# Patient Record
Sex: Female | Born: 1970 | Race: White | Hispanic: Yes | Marital: Married | Smoking: Never smoker
Health system: Southern US, Community
[De-identification: ages and names within clinical notes are randomized; demographics above are authoritative.]

## PROBLEM LIST (undated history)

## (undated) DIAGNOSIS — C801 Malignant (primary) neoplasm, unspecified: Secondary | ICD-10-CM

## (undated) DIAGNOSIS — E7800 Pure hypercholesterolemia, unspecified: Secondary | ICD-10-CM

## (undated) HISTORY — DX: Pure hypercholesterolemia, unspecified: E78.00

---

## 2005-08-22 ENCOUNTER — Other Ambulatory Visit: Admission: RE | Admit: 2005-08-22 | Discharge: 2005-08-22 | Payer: Self-pay | Admitting: Gynecology

## 2006-03-09 ENCOUNTER — Inpatient Hospital Stay (HOSPITAL_COMMUNITY): Admission: AD | Admit: 2006-03-09 | Discharge: 2006-03-12 | Payer: Self-pay | Admitting: Gynecology

## 2006-04-21 ENCOUNTER — Other Ambulatory Visit: Admission: RE | Admit: 2006-04-21 | Discharge: 2006-04-21 | Payer: Self-pay | Admitting: Gynecology

## 2007-04-11 ENCOUNTER — Inpatient Hospital Stay (HOSPITAL_COMMUNITY): Admission: AD | Admit: 2007-04-11 | Discharge: 2007-04-13 | Payer: Self-pay | Admitting: Obstetrics and Gynecology

## 2009-01-04 ENCOUNTER — Inpatient Hospital Stay (HOSPITAL_COMMUNITY): Admission: AD | Admit: 2009-01-04 | Discharge: 2009-01-04 | Payer: Self-pay | Admitting: Obstetrics and Gynecology

## 2009-10-14 ENCOUNTER — Ambulatory Visit: Payer: Self-pay | Admitting: Gynecology

## 2011-02-07 ENCOUNTER — Other Ambulatory Visit: Payer: Self-pay | Admitting: Obstetrics and Gynecology

## 2011-02-07 DIAGNOSIS — N6310 Unspecified lump in the right breast, unspecified quadrant: Secondary | ICD-10-CM

## 2011-02-11 ENCOUNTER — Ambulatory Visit
Admission: RE | Admit: 2011-02-11 | Discharge: 2011-02-11 | Disposition: A | Discharge status: Home / Self Care | Payer: Medicaid Other | Source: Ambulatory Visit | Attending: Obstetrics and Gynecology | Admitting: Obstetrics and Gynecology

## 2011-02-11 DIAGNOSIS — N6310 Unspecified lump in the right breast, unspecified quadrant: Secondary | ICD-10-CM

## 2011-02-22 LAB — CBC
HCT: 38.7 % (ref 36.0–46.0)
Hemoglobin: 12.9 g/dL (ref 12.0–15.0)
MCHC: 33.3 g/dL (ref 30.0–36.0)
MCV: 92 fL (ref 78.0–100.0)
Platelets: 263 10*3/uL (ref 150–400)
RBC: 4.21 MIL/uL (ref 3.87–5.11)
RDW: 12.9 % (ref 11.5–15.5)
WBC: 9.9 10*3/uL (ref 4.0–10.5)

## 2011-02-22 LAB — URINALYSIS, ROUTINE W REFLEX MICROSCOPIC
Bilirubin Urine: NEGATIVE
Glucose, UA: NEGATIVE mg/dL
Hgb urine dipstick: NEGATIVE
Ketones, ur: NEGATIVE mg/dL
Nitrite: NEGATIVE
Protein, ur: NEGATIVE mg/dL
Specific Gravity, Urine: 1.01 (ref 1.005–1.030)
Urobilinogen, UA: 0.2 mg/dL (ref 0.0–1.0)
pH: 7.5 (ref 5.0–8.0)

## 2011-02-22 LAB — GC/CHLAMYDIA PROBE AMP, GENITAL
Chlamydia, DNA Probe: NEGATIVE
GC Probe Amp, Genital: NEGATIVE

## 2011-02-22 LAB — WET PREP, GENITAL
Clue Cells Wet Prep HPF POC: NONE SEEN
Trich, Wet Prep: NONE SEEN
Yeast Wet Prep HPF POC: NONE SEEN

## 2011-02-22 LAB — ABO/RH: ABO/RH(D): O POS

## 2011-02-22 LAB — HCG, QUANTITATIVE, PREGNANCY: hCG, Beta Chain, Quant, S: 4900 m[IU]/mL — ABNORMAL HIGH (ref <5)

## 2011-02-22 LAB — POCT PREGNANCY, URINE: Preg Test, Ur: POSITIVE

## 2011-03-22 NOTE — Discharge Summary (Signed)
NAMEJANERA, Molly Prince        ACCOUNT NO.:  [id number]   MEDICAL RECORD NO.:  [id number]          PATIENT TYPE:  INP   LOCATION:  9114                          FACILITY:  [facility]   PHYSICIAN:  Gerrit Friends. Aldona Bar, M.D.   DATE OF BIRTH:  1971-02-09   DATE OF ADMISSION:  04/11/2007  DATE OF DISCHARGE:  04/13/2007                               DISCHARGE SUMMARY   DISCHARGE DIAGNOSIS:  1. Term pregnancy, delivered 6 pounds 13 ounces female infant, Apgars      09/09.  2. Blood type O positive.   PROCEDURE:  1. Normal spontaneous delivery.  2. Left labial laceration with repair.   SUMMARY:  This 40 year old gravida 2, para 1 was admitted at term in  labor.  She progressed, some meconium-stained fluid was noted, requested  and received an epidural, became fully dilated and subsequently  delivered of a viable female infant.  There was a tight nuchal cord  which was reduced at the time of delivery. A left labial laceration was  repaired at the time of delivery.  Postpartum course was benign.  Discharge hemoglobin 10.2, white count of 11,300, platelet count of  208,000.  On the morning of June 6, she was ambulating well, tolerating  a regular diet well, having normal bowel and bladder function, and was  afebrile.  She was breast-feeding and bottle feeding and was desirous of  discharge.  Accordingly she was given all appropriate instructions per  discharge brochure and understood all instructions well.   DISCHARGE MEDICATIONS:  1. Vitamins - 1 day as long she is breast-feeding.  2. Feosol capsules - one daily.  3. Motrin 600 mg every 6 hours as needed for cramping or mild pain.  4. Tylox 1-2 every 4-6 hours as needed for more severe pain.   She will return the office follow-up in approximately four weeks' time  or as needed.   CONDITION ON DISCHARGE:  Improved.      Gerrit Friends. Aldona Bar, M.D.  Electronically Signed     RMW/MEDQ  D:  04/13/2007  T:  04/13/2007  Job:  [id number]

## 2011-08-25 LAB — RPR: RPR Ser Ql: NONREACTIVE

## 2011-08-25 LAB — CBC
HCT: 30.2 — ABNORMAL LOW
HCT: 34.6 — ABNORMAL LOW
Hemoglobin: 10.2 — ABNORMAL LOW
Hemoglobin: 11.7 — ABNORMAL LOW
MCHC: 33.8
MCHC: 33.8
MCV: 90
MCV: 90.1
Platelets: 208
Platelets: 228
RBC: 3.36 — ABNORMAL LOW
RBC: 3.85 — ABNORMAL LOW
RDW: 14.4 — ABNORMAL HIGH
RDW: 14.5 — ABNORMAL HIGH
WBC: 11.3 — ABNORMAL HIGH
WBC: 9.2

## 2014-09-22 ENCOUNTER — Encounter: Payer: Self-pay | Admitting: Gynecology

## 2014-09-22 ENCOUNTER — Other Ambulatory Visit: Payer: Self-pay

## 2014-09-22 ENCOUNTER — Ambulatory Visit (INDEPENDENT_AMBULATORY_CARE_PROVIDER_SITE_OTHER): Payer: BC Managed Care – PPO | Admitting: Gynecology

## 2014-09-22 ENCOUNTER — Other Ambulatory Visit (HOSPITAL_COMMUNITY)
Admission: RE | Admit: 2014-09-22 | Discharge: 2014-09-22 | Disposition: A | Discharge status: Home / Self Care | Payer: BC Managed Care – PPO | Source: Ambulatory Visit | Attending: Gynecology | Admitting: Gynecology

## 2014-09-22 VITALS — BP 110/76 | Ht 60.5 in | Wt 112.0 lb

## 2014-09-22 DIAGNOSIS — Z01419 Encounter for gynecological examination (general) (routine) without abnormal findings: Secondary | ICD-10-CM

## 2014-09-22 DIAGNOSIS — Z1231 Encounter for screening mammogram for malignant neoplasm of breast: Secondary | ICD-10-CM

## 2014-09-22 DIAGNOSIS — Z23 Encounter for immunization: Secondary | ICD-10-CM

## 2014-09-22 DIAGNOSIS — Z1151 Encounter for screening for human papillomavirus (HPV): Secondary | ICD-10-CM | POA: Insufficient documentation

## 2014-09-22 DIAGNOSIS — Z87410 Personal history of cervical dysplasia: Secondary | ICD-10-CM

## 2014-09-22 LAB — COMPREHENSIVE METABOLIC PANEL
ALT: 14 U/L (ref 0–35)
AST: 15 U/L (ref 0–37)
Albumin: 4.2 g/dL (ref 3.5–5.2)
Alkaline Phosphatase: 53 U/L (ref 39–117)
BUN: 9 mg/dL (ref 6–23)
CO2: 25 mEq/L (ref 19–32)
Calcium: 8.9 mg/dL (ref 8.4–10.5)
Chloride: 102 mEq/L (ref 96–112)
Creat: 0.56 mg/dL (ref 0.50–1.10)
Glucose, Bld: 85 mg/dL (ref 70–99)
Potassium: 4 mEq/L (ref 3.5–5.3)
Sodium: 139 mEq/L (ref 135–145)
Total Bilirubin: 0.4 mg/dL (ref 0.2–1.2)
Total Protein: 7.2 g/dL (ref 6.0–8.3)

## 2014-09-22 LAB — CBC WITH DIFFERENTIAL/PLATELET
Basophils Absolute: 0 10*3/uL (ref 0.0–0.1)
Basophils Relative: 0 % (ref 0–1)
Eosinophils Absolute: 0.2 10*3/uL (ref 0.0–0.7)
Eosinophils Relative: 2 % (ref 0–5)
HCT: 38.2 % (ref 36.0–46.0)
Hemoglobin: 13.1 g/dL (ref 12.0–15.0)
Lymphocytes Relative: 21 % (ref 12–46)
Lymphs Abs: 1.7 10*3/uL (ref 0.7–4.0)
MCH: 29.7 pg (ref 26.0–34.0)
MCHC: 34.3 g/dL (ref 30.0–36.0)
MCV: 86.6 fL (ref 78.0–100.0)
MPV: 11.3 fL (ref 9.4–12.4)
Monocytes Absolute: 0.5 10*3/uL (ref 0.1–1.0)
Monocytes Relative: 6 % (ref 3–12)
Neutro Abs: 5.9 10*3/uL (ref 1.7–7.7)
Neutrophils Relative %: 71 % (ref 43–77)
Platelets: 262 10*3/uL (ref 150–400)
RBC: 4.41 MIL/uL (ref 3.87–5.11)
RDW: 14.1 % (ref 11.5–15.5)
WBC: 8.3 10*3/uL (ref 4.0–10.5)

## 2014-09-22 LAB — TSH: TSH: 2.344 u[IU]/mL (ref 0.350–4.500)

## 2014-09-22 LAB — LIPID PANEL
Cholesterol: 174 mg/dL (ref 0–200)
HDL: 50 mg/dL (ref >39)
LDL Cholesterol: 106 mg/dL — ABNORMAL HIGH (ref 0–99)
Total CHOL/HDL Ratio: 3.5 Ratio
Triglycerides: 88 mg/dL (ref <150)
VLDL: 18 mg/dL (ref 0–40)

## 2014-09-22 NOTE — Progress Notes (Signed)
Molly Prince April 22, 1971 [id number]   History:    43 y.o.  for annual gyn exam who has not been seen in the office since 2010. Patient stated that she had a normal Pap smear in [place] last year. 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 Subsequent Pap smears have been normal. Patient had a Mirena IUD placed after one of her pregnancies. It is due to be removed in August 2016. Patient is currently fasting. Patient requesting flu vaccine. Her last mammogram was in 2014 which was normal.  Past medical history,surgical history, family history and social history were all reviewed and documented in the EPIC chart.  Gynecologic History No LMP recorded. Patient is not currently having periods (Reason: IUD). Contraception: IUD Last Pap: 2014. Results were: patient states that it was in [place] and was normal Last mammogram: 2014. Results were: patient states that it was done here in [place] although we do not have the report and she states it was normal  Obstetric History OB History  Gravida Para Term Preterm AB SAB TAB Ectopic Multiple Living  5 3   2     3     # Outcome Date GA Lbr Len/2nd Weight Sex Delivery Anes PTL Lv  5 AB           4 AB           3 Para           2 Para           1 Para                ROS: A ROS was performed and pertinent positives and negatives are included in the history.  GENERAL: No fevers or chills. HEENT: No change in vision, no earache, sore throat or sinus congestion. NECK: No pain or stiffness. CARDIOVASCULAR: No chest pain or pressure. No palpitations. PULMONARY: No shortness of breath, cough or wheeze. GASTROINTESTINAL: No abdominal pain, nausea, vomiting or diarrhea, melena or bright red blood per rectum. GENITOURINARY: No urinary frequency, urgency, hesitancy or dysuria. MUSCULOSKELETAL: No joint or muscle pain, no back pain, no recent trauma. DERMATOLOGIC: No  rash, no itching, no lesions. ENDOCRINE: No polyuria, polydipsia, no heat or cold intolerance. No recent change in weight. HEMATOLOGICAL: No anemia or easy bruising or bleeding. NEUROLOGIC: No headache, seizures, numbness, tingling or weakness. PSYCHIATRIC: No depression, no loss of interest in normal activity or change in sleep pattern.     Exam: chaperone present  BP 110/76 mmHg  Ht 5' 0.5" (1.537 m)  Wt 112 lb (50.803 kg)  BMI 21.51 kg/m2  Body mass index is 21.51 kg/(m^2).  General appearance : Well developed well nourished female. No acute distress HEENT: Neck supple, trachea midline, no carotid bruits, no thyroidmegaly Lungs: Clear to auscultation, no rhonchi or wheezes, or rib retractions  Heart: Regular rate and rhythm, no murmurs or gallops Breast:Examined in sitting and supine position were symmetrical in appearance, no palpable masses or tenderness,  no skin retraction, no nipple inversion, no nipple discharge, no skin discoloration, no axillary or supraclavicular lymphadenopathy Abdomen: no palpable masses or tenderness, no rebound or guarding Extremities: no edema or skin discoloration or tenderness  Pelvic:  Bartholin, Urethra, Skene Glands: Within normal limits             Vagina: No gross lesions or discharge  Cervix: No gross lesions or discharge, IUD string seen  Uterus  anteverted, normal size, shape and consistency, non-tender and mobile  Adnexa  Without masses or tenderness  Anus and perineum  normal   Rectovaginal  normal sphincter tone without palpated masses or tenderness             Hemoccult not indicated     Assessment/Plan:  44 y.o. female for annual exam due to return in August 2016 to change her Mirena IUD. We discussed importance of monthly breast exam. Pap smear with HPV screening was done today. The following labs were ordered today: CBC, fasting lipid profile, comprehensive metabolic panel, TSH, and urinalysis. Patient received the flu vaccine  today.   Terrance Mass MD, 11:40 AM 09/22/2014

## 2014-09-22 NOTE — Addendum Note (Signed)
Addended by: Thurnell Garbe A on: 09/22/2014 11:46 AM   Modules accepted: Orders

## 2014-09-22 NOTE — Patient Instructions (Signed)
Intranasal H1N1 Influenza (swine flu) Vaccine Qu es este medicamento? VACUNA INTRANASAL CONTRA LA INFLUENZA H1N1 (GRIPE PORCINA) es una vacuna para proteger contra una infeccin de la influenza pandmica H1N1, tambin conocida como la gripe porcina. La vacuna solo ayuda a protegerle contra esta cepa de influenza. Esta vacuna no ayuda a reducir el riesgo de contraer otros tipos de la influenza. Tambin puede ser necesario ponerse la vacuna contra la influenza estacional. Este medicamento puede ser utilizado para otros usos; si tiene alguna pregunta consulte con su proveedor de atencin mdica o con su farmacutico. Qu le debo informar a mi profesional de la salud antes de tomar este medicamento? Necesita saber si usted presenta alguno de los siguientes problemas o situaciones: -asma o sibilancias -sndrome de Guillain-Barre -problemas del sistema inmunolgico -si es menor de 18 aos de edad y toma aspirina -una reaccin alrgica o inusual vacuna antigripal intranasales, a los huevos, a la gentamicina, a la gelatina, a la arginina, a otros medicamentos, alimentos, colorantes o conservantes -si est embarazada o buscando quedar embarazada -si est amamantando a un beb Cmo debo [employer]? La vacuna se administra en la nariz. La administra un profesional de [employer]	[place]. Recibir una copia de informacin escrita sobre la vacuna antes de cada vacuna. Asegrese de leer este folleto cada vez cuidadosamente. Este folleto puede cambiar con frecuencia. Hable con su pediatra para informarse acerca del uso de este medicamento en nios. Aunque este medicamento ha sido recetado a nios tan menores como de 2 aos de edad para condiciones selectivas, las precauciones se aplican. Sobredosis: Pngase en contacto inmediatamente con un centro toxicolgico o una sala de urgencia si usted cree que haya tomado demasiado medicamento. ATENCIN: [employer] es solo para usted. No comparta este  medicamento con nadie. Qu sucede si me olvido de una dosis? Si es necesario, mantenga sus citas para las siguientes dosis (dosis de Public librarian) como se le haya indicado. Es importante de no olvidarse de ninguna dosis. Informe a su mdico o a su profesional de la salud si no puede asistir a Photographer. Qu puede interactuar con este medicamento? No tome esta medicina con ninguno de los siguientes medicamentos: -anakinra -rilonacept -inhibidores del factor de necrosis tumoral (FNT), tales como adalimumab, etanercept, infliximab, golimumab o certolizumab Esta medicina tambin puede interactuar con los siguientes medicamentos: -aspirina o medicamentos tipo aspirina -medicamentos para un transplante de rganos -medicamentos para tratar el cncer -medicamentos para tratar la gripe -otras vacunas -medicamentos esteroideos, como la prednisona o la cortisona Puede ser que esta lista no menciona todas las posibles interacciones. Informe a su profesional de [employer]	[place] de [employer] productos a base de hierbas, medicamentos de [place] o suplementos nutritivos que est tomando. Si usted fuma, consume bebidas alcohlicas o si utiliza drogas ilegales, indqueselo tambin a su profesional de [employer]	[place]. Algunas sustancias pueden interactuar con su medicamento. A qu debo estar atento al usar [employer]? Informe a su mdico inmediatamente sobre [employer] secundarios. Despus de recibir esta vacuna, evite tener contacto cercano con personas que tengan problemas del sistema inmunolgico graves durante 7 [place]. Usted puede darles la gripe. Esta vacuna reduce el riesgo de contraer la influenza pandmica H1N1. Puede contraer una infeccin de la influenza H1N1 ms leve si est alrededor de personas que tienen esta influenza. Esta vacuna antigripal no le protege contra resfros u otras enfermedades incluyendo otros virus de la influenza. Tambin puede ser necesario ponerse la vacuna contra la influenza  estacional. Qu efectos secundarios puedo Best boy  al Ardell Isaacs este medicamento? Efectos secundarios que debe informar a su mdico o a Barrister's clerk de la salud tan pronto como sea posible: -Chief of Staff como erupcin cutnea, picazn o urticarias, hinchazn de la cara, labios o lengua -problemas respiratorios -debilidad muscular -parlisis inusual de la cara Efectos secundarios que, por lo general, no requieren atencin mdica (debe informarlos a su mdico o a su profesional de la salud si persisten o si son molestos): -escalofros -tos -dolor de cabeza -dolores o molestias musculares -goteo de la nariz o Lawyer tapada -dolor de garganta -molestar estomacal -cansancio Puede ser que esta lista no menciona todos los posibles efectos secundarios. Comunquese a su mdico por asesoramiento mdico [employer] Usted puede informar los efectos secundarios a la FDA por telfono al 1-800-FDA-1088. Dnde debo guardar mi medicina? Esta vacuna se administrar en una clnica, farmacia, o en el consultorio de un mdico o un profesional de la salud. No se le entregar la vacuna para guardar en su domicilio. ATENCIN: Este folleto es un resumen. Puede ser que no cubra toda la posible informacin. Si usted tiene preguntas acerca de esta medicina, consulte con su mdico, su farmacutico o su profesional de Technical sales engineer.  2015, Elsevier/Gold Standard. (2009-06-12 15:22:03)

## 2014-09-23 LAB — URINALYSIS W MICROSCOPIC + REFLEX CULTURE
Bacteria, UA: NONE SEEN
Bilirubin Urine: NEGATIVE
Casts: NONE SEEN
Crystals: NONE SEEN
Glucose, UA: NEGATIVE mg/dL
Hgb urine dipstick: NEGATIVE
Ketones, ur: NEGATIVE mg/dL
Leukocytes, UA: NEGATIVE
Nitrite: NEGATIVE
Protein, ur: NEGATIVE mg/dL
Specific Gravity, Urine: <1.005 — ABNORMAL LOW (ref 1.005–1.030)
Squamous Epithelial / LPF: NONE SEEN
Urobilinogen, UA: 0.2 mg/dL (ref 0.0–1.0)
pH: 7 (ref 5.0–8.0)

## 2014-09-24 LAB — CYTOLOGY - PAP

## 2014-09-29 ENCOUNTER — Encounter: Payer: Self-pay | Admitting: Gynecology

## 2014-10-14 ENCOUNTER — Ambulatory Visit
Admission: RE | Admit: 2014-10-14 | Discharge: 2014-10-14 | Disposition: A | Discharge status: Home / Self Care | Payer: BC Managed Care – PPO | Source: Ambulatory Visit

## 2014-10-14 DIAGNOSIS — Z1231 Encounter for screening mammogram for malignant neoplasm of breast: Secondary | ICD-10-CM

## 2015-08-17 ENCOUNTER — Telehealth: Payer: Self-pay | Admitting: Gynecology

## 2015-08-17 NOTE — Telephone Encounter (Signed)
08/17/15-Claudia to call pt to let her know that the [facility] insurance she has will cover the Mirena and removal of old one at 100%, no copay, for contraception.wl

## 2015-09-14 ENCOUNTER — Encounter: Payer: Self-pay | Admitting: Gynecology

## 2015-09-14 ENCOUNTER — Ambulatory Visit (INDEPENDENT_AMBULATORY_CARE_PROVIDER_SITE_OTHER): Payer: 59 | Admitting: Gynecology

## 2015-09-14 VITALS — BP 112/70

## 2015-09-14 DIAGNOSIS — Z975 Presence of (intrauterine) contraceptive device: Secondary | ICD-10-CM | POA: Insufficient documentation

## 2015-09-14 DIAGNOSIS — Z8742 Personal history of other diseases of the female genital tract: Secondary | ICD-10-CM

## 2015-09-14 DIAGNOSIS — Z30433 Encounter for removal and reinsertion of intrauterine contraceptive device: Secondary | ICD-10-CM

## 2015-09-14 LAB — WET PREP FOR TRICH, YEAST, CLUE
Clue Cells Wet Prep HPF POC: NONE SEEN
Trich, Wet Prep: NONE SEEN
WBC, Wet Prep HPF POC: NONE SEEN
Yeast Wet Prep HPF POC: NONE SEEN

## 2015-09-14 NOTE — Patient Instructions (Signed)
Colocación de un dispositivo intrauterino - Cuidados posteriores  (Intrauterine Device Insertion, Care After)  Siga estas instrucciones durante las próximas semanas. Estas indicaciones le proporcionan información general acerca de cómo deberá cuidarse después del procedimiento. El médico también podrá darle instrucciones más específicas. El tratamiento ha sido planificado según las prácticas médicas actuales, pero en algunos casos pueden ocurrir problemas. Comuníquese con el médico si tiene algún problema o tiene dudas después del procedimiento.  QUÉ ESPERAR DESPUÉS DEL PROCEDIMIENTO  La inserción del DIU puede causar molestias, como cólicos. que deberían mejorar una vez que el DIU esté en su lugar. Podrá tener sangrado después del procedimiento. Esto es normal. Varía desde un sangrado ligero durante un par de días hasta un sangrado similar al menstrual. Cuando el DIU esté en su lugar, se extenderá un hilo de 1 a 2 pulgadas (2,5 a 5 cm) por el cuello del útero en la vagina. El hilo no debería molestarle a usted ni a su pareja. De lo contrario, consulte con su médico.   INSTRUCCIONES PARA EL CUIDADO EN EL HOGAR   · Controle su DIU para asegurarse de que esté en su lugar, antes de reanudar la actividad sexual. Tiene que sentir los hilos. Si no los siente, algo puede estar mal. El DIU puede haberse salido del útero o éste puede haber sido atravesado (perforado) durante la colocación. Además, si los hilos son más largos, puede significar que el DIU se está saliendo del útero. Si ocurre alguno de estos problemas, no estará protegida y podrá quedar embarazada.  · Puede volver a tener relaciones sexuales si no tiene problemas con el DIU. El DIU de cobre se considera efectivo y funciona de inmediato, si se inserta dentro de los 7 días del inicio del período. Será necesario que utilice un método anticonceptivo adicional durante 7 días, si el DIU se inserta en algún otro momento del ciclo.  · Controle que el DIU sigue en su  lugar sintiendo los hilos después de cada período menstrual.  · Es posible que necesite tomar analgésicos, como acetaminofeno o ibuprofeno. Tome todos los medicamentos como le indicó el médico.  SOLICITE ATENCIÓN MÉDICA SI:   · Tiene un sangrado más abundante o dura más de un ciclo menstrual normal.  · Tiene fiebre.  · Siente cólicos o dolor abdominal que no se alivian con medicamentos.  · Siente dolor abdominal que no parece estar relacionado con el área en que sentía los cólicos y el dolor anteriormente.  · Se siente mareada, inusualmente débil o se desmaya.  · Tiene flujo vaginal u olores anormales.  · Siente dolor durante las relaciones sexuales.  · No puede sentir los hilos del DIU o los siente más largos.  · Siente que el DIU está en la abertura del cuello del útero, en la vagina.  · Piensa que está embarazada o no tiene su período menstrual.  · El hilo del DIU está lastimando a su pareja sexual.  ASEGÚRESE DE QUE:  · Comprende estas instrucciones.  · Controlará su afección.  · Recibirá ayuda de inmediato si no mejora o si empeora.     Esta información no tiene como fin reemplazar el consejo del médico. Asegúrese de hacerle al médico cualquier pregunta que tenga.     Document Released: 07/18/2012 Document Revised: 08/14/2013  Elsevier Interactive Patient Education ©2016 Elsevier Inc.

## 2015-09-14 NOTE — Progress Notes (Signed)
   Patient is a 44 year old who presented to the office today to remove her expired Mirena IUD with replacement of a new one. She has done well with this Mirena IUD. She has no menstrual cycle. She is scheduled for annual exam next month. She states that time she feels like she has a vaginal discharge. She is in a monogamous relationship.  Prior to removing and placement of the Mirena IUD a wet prep was obtained and was essentially negative.                                                                    IUD procedure note       Patient presented to the office today for placement of Mirena IUD. The patient had previously been provided with literature information on this method of contraception. The risks benefits and pros and cons were discussed and all her questions were answered. She is fully aware that this form of contraception is 99% effective and is good for 5 years.  Pelvic exam: Bartholin urethra Skene glands: Within normal limits Vagina: No lesions or discharge Cervix: No lesions or discharge, IUD string was visualized Uterus: Anteverted position Adnexa: No masses or tenderness Rectal exam: Not done  The cervix was cleansed with Betadine solution. A single-tooth tenaculum was placed on the anterior cervical lip. The IUD string was grasped with a ring forcep retrieved shown to the patient and discarded. The uterus was then sounded to 7 centimeter. The IUD was shown to the patient and inserted in a sterile fashion. The IUD string was trimmed. The single-tooth tenaculum was removed. Patient was instructed to return back to the office in one month for follow up.    Mirena IUD lot number [id number]     I have recommended the patient use refresh a probiotic gel twice a week for BV and yeast prophylaxis.

## 2015-09-21 ENCOUNTER — Other Ambulatory Visit: Payer: Self-pay

## 2015-09-21 DIAGNOSIS — Z1231 Encounter for screening mammogram for malignant neoplasm of breast: Secondary | ICD-10-CM

## 2015-10-08 ENCOUNTER — Ambulatory Visit: Payer: Self-pay

## 2015-10-09 ENCOUNTER — Other Ambulatory Visit: Payer: Self-pay

## 2015-10-09 ENCOUNTER — Ambulatory Visit
Admission: RE | Admit: 2015-10-09 | Discharge: 2015-10-09 | Disposition: A | Discharge status: Home / Self Care | Payer: 59 | Source: Ambulatory Visit

## 2015-10-09 DIAGNOSIS — Z1231 Encounter for screening mammogram for malignant neoplasm of breast: Secondary | ICD-10-CM

## 2015-10-22 ENCOUNTER — Ambulatory Visit (INDEPENDENT_AMBULATORY_CARE_PROVIDER_SITE_OTHER): Payer: 59 | Admitting: Gynecology

## 2015-10-22 ENCOUNTER — Encounter: Payer: Self-pay | Admitting: Gynecology

## 2015-10-22 ENCOUNTER — Other Ambulatory Visit (HOSPITAL_COMMUNITY)
Admission: RE | Admit: 2015-10-22 | Discharge: 2015-10-22 | Disposition: A | Discharge status: Home / Self Care | Payer: 59 | Source: Ambulatory Visit | Attending: Gynecology | Admitting: Gynecology

## 2015-10-22 VITALS — BP 108/76 | Ht 59.5 in | Wt 115.0 lb

## 2015-10-22 DIAGNOSIS — R221 Localized swelling, mass and lump, neck: Secondary | ICD-10-CM

## 2015-10-22 DIAGNOSIS — Z30431 Encounter for routine checking of intrauterine contraceptive device: Secondary | ICD-10-CM | POA: Diagnosis not present

## 2015-10-22 DIAGNOSIS — Z01419 Encounter for gynecological examination (general) (routine) without abnormal findings: Secondary | ICD-10-CM

## 2015-10-22 DIAGNOSIS — Z1151 Encounter for screening for human papillomavirus (HPV): Secondary | ICD-10-CM | POA: Insufficient documentation

## 2015-10-22 NOTE — Addendum Note (Signed)
Addended by: Thurnell Garbe A on: 10/22/2015 11:38 AM   Modules accepted: Orders, SmartSet

## 2015-10-22 NOTE — Progress Notes (Signed)
Molly Prince [birth year] [id number]   History:    44 y.o.  for annual gyn exam as well as for her 1 month follow-up after replacing her expired Mirena IUD. She's having no problems with a Mirena IUD. 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 Subsequent Pap smears have been normal. Patient requesting flu vaccine today.  Past medical history,surgical history, family history and social history were all reviewed and documented in the EPIC chart.  Gynecologic History No LMP recorded. Patient is not currently having periods (Reason: IUD). Contraception: IUD Last Pap: 2015. Results were: normal Last mammogram: 2016. Results were: 3-D mammogram normal but dense  Obstetric History OB History  Gravida Para Term Preterm AB SAB TAB Ectopic Multiple Living  5 3   2     3     # Outcome Date GA Lbr Len/2nd Weight Sex Delivery Anes PTL Lv  5 AB           4 AB           3 Para           2 Para           1 Para                ROS: A ROS was performed and pertinent positives and negatives are included in the history.  GENERAL: No fevers or chills. HEENT: No change in vision, no earache, sore throat or sinus congestion. NECK: No pain or stiffness. CARDIOVASCULAR: No chest pain or pressure. No palpitations. PULMONARY: No shortness of breath, cough or wheeze. GASTROINTESTINAL: No abdominal pain, nausea, vomiting or diarrhea, melena or bright red blood per rectum. GENITOURINARY: No urinary frequency, urgency, hesitancy or dysuria. MUSCULOSKELETAL: No joint or muscle pain, no back pain, no recent trauma. DERMATOLOGIC: No rash, no itching, no lesions. ENDOCRINE: No polyuria, polydipsia, no heat or cold intolerance. No recent change in weight. HEMATOLOGICAL: No anemia or easy bruising or bleeding. NEUROLOGIC: No headache, seizures,  numbness, tingling or weakness. PSYCHIATRIC: No depression, no loss of interest in normal activity or change in sleep pattern.     Exam: chaperone present  BP 108/76 mmHg  Ht 4' 11.5" (1.511 m)  Wt 115 lb (52.164 kg)  BMI 22.85 kg/m2  Body mass index is 22.85 kg/(m^2).  General appearance : Well developed well nourished female. No acute distress HEENT: Eyes: no retinal hemorrhage or exudates,  Neck left thyroid nodule 2 x 2 centimeters mobile nontender  Lungs: Clear to auscultation, no rhonchi or wheezes, or rib retractions  Heart: Regular rate and rhythm, no murmurs or gallops Breast:Examined in sitting and supine position were symmetrical in appearance, no palpable masses or tenderness,  no skin retraction, no nipple inversion, no nipple discharge, no skin discoloration, no axillary or supraclavicular lymphadenopathy Abdomen: no palpable masses or tenderness, no rebound or guarding Extremities: no edema or skin discoloration or tenderness  Pelvic:  Bartholin, Urethra, Skene Glands: Within normal limits             Vagina: No gross lesions or discharge  Cervix: No gross lesions or discharge, IUD string seen  Uterus  anteverted, normal size, shape and consistency, non-tender and mobile  Adnexa  Without masses or tenderness  Anus and perineum  normal   Rectovaginal  normal sphincter tone without  palpated masses or tenderness             Hemoccult not indicated     Assessment/Plan:  44 y.o. female for annual exam will return back next week for her fasting blood work consisting of the following: Comprehensive metabolic panel, fasting lipid profile, TSH, CBC, and urinalysis. We'll schedule a thyroid ultrasound next week. We'll await results and refer accordingly. Patient was reminded to do her monthly breast exam. Patient received flu vaccine today.   Terrance Mass MD, 11:15 AM 10/22/2015

## 2015-10-23 ENCOUNTER — Other Ambulatory Visit: Payer: 59

## 2015-10-23 ENCOUNTER — Telehealth: Payer: Self-pay | Admitting: *Deleted

## 2015-10-23 DIAGNOSIS — E079 Disorder of thyroid, unspecified: Secondary | ICD-10-CM

## 2015-10-23 LAB — CYTOLOGY - PAP

## 2015-10-23 NOTE — Telephone Encounter (Signed)
-----   Message from Terrance Mass, MD sent at 10/22/2015 11:14 AM EST ----- Anderson Malta, please schedule thyroid ultrasound on this patient with a left thyroid mass which on exam measures 2 x 2 cm. Patient would prefer to have it done at [facility] hospital if possible

## 2015-10-23 NOTE — Telephone Encounter (Signed)
Appointment 10/27/15 @ 12:45pm claudia informed patient

## 2015-10-27 ENCOUNTER — Other Ambulatory Visit: Payer: 59

## 2015-10-27 ENCOUNTER — Ambulatory Visit (HOSPITAL_COMMUNITY)
Admission: RE | Admit: 2015-10-27 | Discharge: 2015-10-27 | Disposition: A | Discharge status: Home / Self Care | Payer: 59 | Source: Ambulatory Visit | Attending: Gynecology | Admitting: Gynecology

## 2015-10-27 DIAGNOSIS — R599 Enlarged lymph nodes, unspecified: Secondary | ICD-10-CM | POA: Diagnosis not present

## 2015-10-27 DIAGNOSIS — E079 Disorder of thyroid, unspecified: Secondary | ICD-10-CM

## 2015-10-27 DIAGNOSIS — E041 Nontoxic single thyroid nodule: Secondary | ICD-10-CM | POA: Diagnosis not present

## 2015-10-27 LAB — COMPREHENSIVE METABOLIC PANEL
ALT: 16 U/L (ref 6–29)
AST: 17 U/L (ref 10–35)
Albumin: 4.1 g/dL (ref 3.6–5.1)
Alkaline Phosphatase: 54 U/L (ref 33–115)
BUN: 12 mg/dL (ref 7–25)
CO2: 24 mmol/L (ref 20–31)
Calcium: 8.7 mg/dL (ref 8.6–10.2)
Chloride: 104 mmol/L (ref 98–110)
Creat: 0.62 mg/dL (ref 0.50–1.10)
Glucose, Bld: 83 mg/dL (ref 65–99)
Potassium: 4.1 mmol/L (ref 3.5–5.3)
Sodium: 137 mmol/L (ref 135–146)
Total Bilirubin: 0.4 mg/dL (ref 0.2–1.2)
Total Protein: 6.5 g/dL (ref 6.1–8.1)

## 2015-10-27 LAB — CBC WITH DIFFERENTIAL/PLATELET
Basophils Absolute: 0 10*3/uL (ref 0.0–0.1)
Basophils Relative: 0 % (ref 0–1)
Eosinophils Absolute: 0.2 10*3/uL (ref 0.0–0.7)
Eosinophils Relative: 2 % (ref 0–5)
HCT: 36.4 % (ref 36.0–46.0)
Hemoglobin: 12.5 g/dL (ref 12.0–15.0)
Lymphocytes Relative: 20 % (ref 12–46)
Lymphs Abs: 1.5 10*3/uL (ref 0.7–4.0)
MCH: 30 pg (ref 26.0–34.0)
MCHC: 34.3 g/dL (ref 30.0–36.0)
MCV: 87.3 fL (ref 78.0–100.0)
MPV: 10.9 fL (ref 8.6–12.4)
Monocytes Absolute: 0.5 10*3/uL (ref 0.1–1.0)
Monocytes Relative: 6 % (ref 3–12)
Neutro Abs: 5.4 10*3/uL (ref 1.7–7.7)
Neutrophils Relative %: 72 % (ref 43–77)
Platelets: 251 10*3/uL (ref 150–400)
RBC: 4.17 MIL/uL (ref 3.87–5.11)
RDW: 14.1 % (ref 11.5–15.5)
WBC: 7.5 10*3/uL (ref 4.0–10.5)

## 2015-10-27 LAB — THYROID PANEL WITH TSH
Free Thyroxine Index: 1.8 (ref 1.4–3.8)
T3 Uptake: 29 % (ref 22–35)
T4, Total: 6.2 ug/dL (ref 4.5–12.0)
TSH: 2.81 u[IU]/mL (ref 0.350–4.500)

## 2015-10-27 LAB — LIPID PANEL
Cholesterol: 149 mg/dL (ref 125–200)
HDL: 40 mg/dL — ABNORMAL LOW (ref >=46)
LDL Cholesterol: 95 mg/dL (ref <130)
Total CHOL/HDL Ratio: 3.7 Ratio (ref <=5.0)
Triglycerides: 70 mg/dL (ref <150)
VLDL: 14 mg/dL (ref <30)

## 2015-10-28 LAB — URINALYSIS W MICROSCOPIC + REFLEX CULTURE
Bacteria, UA: NONE SEEN [HPF]
Bilirubin Urine: NEGATIVE
Casts: NONE SEEN [LPF]
Crystals: NONE SEEN [HPF]
Glucose, UA: NEGATIVE
Hgb urine dipstick: NEGATIVE
Leukocytes, UA: NEGATIVE
Nitrite: NEGATIVE
Protein, ur: NEGATIVE
Specific Gravity, Urine: 1.02 (ref 1.001–1.035)
WBC, UA: NONE SEEN WBC/HPF (ref <=5)
Yeast: NONE SEEN [HPF]
pH: 6.5 (ref 5.0–8.0)

## 2015-10-29 ENCOUNTER — Other Ambulatory Visit: Payer: Self-pay | Admitting: *Deleted

## 2015-10-29 MED ORDER — NITROFURANTOIN MONOHYD MACRO 100 MG PO CAPS: 100.0000 mg | ORAL_CAPSULE | Freq: Two times a day (BID) | ORAL | Status: DC

## 2015-10-30 LAB — URINE CULTURE: Colony Count: >=100000

## 2015-11-03 ENCOUNTER — Other Ambulatory Visit: Payer: Self-pay | Admitting: Gynecology

## 2015-11-03 MED ORDER — LEVOFLOXACIN 250 MG PO TABS: 250.0000 mg | ORAL_TABLET | Freq: Every day | ORAL | Status: DC

## 2015-11-12 ENCOUNTER — Other Ambulatory Visit: Payer: Self-pay | Admitting: Surgery

## 2015-11-12 DIAGNOSIS — E041 Nontoxic single thyroid nodule: Secondary | ICD-10-CM

## 2015-11-26 ENCOUNTER — Ambulatory Visit
Admission: RE | Admit: 2015-11-26 | Discharge: 2015-11-26 | Disposition: A | Discharge status: Home / Self Care | Payer: BLUE CROSS/BLUE SHIELD | Source: Ambulatory Visit | Attending: Surgery | Admitting: Surgery

## 2015-11-26 ENCOUNTER — Other Ambulatory Visit (HOSPITAL_COMMUNITY)
Admission: RE | Admit: 2015-11-26 | Discharge: 2015-11-26 | Disposition: A | Discharge status: Home / Self Care | Payer: BLUE CROSS/BLUE SHIELD | Source: Ambulatory Visit | Attending: Radiology | Admitting: Radiology

## 2015-11-26 DIAGNOSIS — E041 Nontoxic single thyroid nodule: Secondary | ICD-10-CM | POA: Insufficient documentation

## 2015-12-11 ENCOUNTER — Ambulatory Visit: Payer: Self-pay | Admitting: Surgery

## 2016-01-01 ENCOUNTER — Encounter (HOSPITAL_COMMUNITY)
Admission: RE | Admit: 2016-01-01 | Discharge: 2016-01-01 | Disposition: A | Discharge status: Home / Self Care | Payer: BLUE CROSS/BLUE SHIELD | Source: Ambulatory Visit | Attending: Anesthesiology | Admitting: Anesthesiology

## 2016-01-01 ENCOUNTER — Encounter (HOSPITAL_COMMUNITY)
Admission: RE | Admit: 2016-01-01 | Discharge: 2016-01-01 | Disposition: A | Discharge status: Home / Self Care | Payer: BLUE CROSS/BLUE SHIELD | Source: Ambulatory Visit | Attending: Surgery | Admitting: Surgery

## 2016-01-01 ENCOUNTER — Encounter (HOSPITAL_COMMUNITY): Payer: Self-pay

## 2016-01-01 DIAGNOSIS — C73 Malignant neoplasm of thyroid gland: Secondary | ICD-10-CM | POA: Diagnosis not present

## 2016-01-01 DIAGNOSIS — Z01818 Encounter for other preprocedural examination: Secondary | ICD-10-CM | POA: Insufficient documentation

## 2016-01-01 DIAGNOSIS — Z01811 Encounter for preprocedural respiratory examination: Secondary | ICD-10-CM

## 2016-01-01 DIAGNOSIS — Z01812 Encounter for preprocedural laboratory examination: Secondary | ICD-10-CM | POA: Insufficient documentation

## 2016-01-01 HISTORY — DX: Malignant (primary) neoplasm, unspecified: C80.1

## 2016-01-01 LAB — HCG, SERUM, QUALITATIVE: Preg, Serum: NEGATIVE

## 2016-01-01 LAB — CBC
HCT: 39.4 % (ref 36.0–46.0)
Hemoglobin: 13.5 g/dL (ref 12.0–15.0)
MCH: 30 pg (ref 26.0–34.0)
MCHC: 34.3 g/dL (ref 30.0–36.0)
MCV: 87.6 fL (ref 78.0–100.0)
Platelets: 206 10*3/uL (ref 150–400)
RBC: 4.5 MIL/uL (ref 3.87–5.11)
RDW: 12.9 % (ref 11.5–15.5)
WBC: 7.8 10*3/uL (ref 4.0–10.5)

## 2016-01-01 LAB — BASIC METABOLIC PANEL
Anion gap: 8 (ref 5–15)
BUN: 11 mg/dL (ref 6–20)
CO2: 23 mmol/L (ref 22–32)
Calcium: 8.8 mg/dL — ABNORMAL LOW (ref 8.9–10.3)
Chloride: 107 mmol/L (ref 101–111)
Creatinine, Ser: 0.59 mg/dL (ref 0.44–1.00)
GFR calc Af Amer: >60 mL/min (ref >60)
GFR calc non Af Amer: >60 mL/min (ref >60)
Glucose, Bld: 82 mg/dL (ref 65–99)
Potassium: 3.6 mmol/L (ref 3.5–5.1)
Sodium: 138 mmol/L (ref 135–145)

## 2016-01-01 NOTE — Progress Notes (Signed)
Denies having a PCP Denies ever seeing a cardiologist. Denies ever having a card cath, stress test, Echo Pt request that we not use the word cancer if anyone else is with her the day of surgery. Please use nodule instead.

## 2016-01-01 NOTE — Pre-Procedure Instructions (Signed)
Molly Prince  01/01/2016      [id number] - [place], [place] - [address] [place] [address] [place] [address] Phone: [phone] Fax: [phone]    Your procedure is scheduled on March 3   Report to [place] at 530 A.M.  Call this number if you have problems the morning of surgery:  [phone]   Remember:  Do not eat food or drink liquids after midnight.  Take these medicines the morning of surgery with A SIP OF WATER: NA  Stop taking aspirin, Ibuprofen, Advil, Motrin, Aleve, BC's, Goody's, Herbal medications, Fish Oil   Do not wear jewelry, make-up or nail polish.  Do not wear lotions, powders, or perfumes.  You may wear deodorant.  Do not shave 48 hours prior to surgery.  Men may shave face and neck.  Do not bring valuables to the hospital.  [facility] is not responsible for any belongings or valuables.  Contacts, dentures or bridgework may not be worn into surgery.  Leave your suitcase in the car.  After surgery it may be brought to your room.  For patients admitted to the hospital, discharge time will be determined by your treatment team.  Patients discharged the day of surgery will not be allowed to drive home.    Special instructions:  [facility] - Preparing for Surgery  Before surgery, you can play an important role.  Because skin is not sterile, your skin needs to be as free of germs as possible.  You can reduce the number of germs on you skin by washing with CHG (chlorahexidine gluconate) soap before surgery.  CHG is an antiseptic cleaner which kills germs and bonds with the skin to continue killing germs even after washing.  Please DO NOT use if you have an allergy to CHG or antibacterial soaps.  If your skin becomes reddened/irritated stop using the CHG and inform your nurse when you arrive at Short Stay.  Do not shave (including legs and underarms) for at least 48  hours prior to the first CHG shower.  You may shave your face.  Please follow these instructions carefully:   1.  Shower with CHG Soap the night before surgery and the    morning of Surgery.  2.  If you choose to wash your hair, wash your hair first as usual with your   normal shampoo.  3.  After you shampoo, rinse your hair and body thoroughly to remove the  Shampoo.  4.  Use CHG as you would any other liquid soap.  You can apply chg directly  to the skin and wash gently with scrungie or a clean washcloth.  5.  Apply the CHG Soap to your body ONLY FROM THE NECK DOWN.    Do not use on open wounds or open sores.  Avoid contact with your eyes,  ears, mouth and genitals (private parts).  Wash genitals (private parts)   with your normal soap.  6.  Wash thoroughly, paying special attention to the area where your surgery will be performed.  7.  Thoroughly rinse your body with warm water from the neck down.  8.  DO NOT shower/wash with your normal soap after using and rinsing off  the CHG Soap.  9.  Pat yourself dry with a clean towel.            10.  Wear clean  pajamas.            11.  Place clean sheets on your bed the night of your first shower and do not sleep with pets.  Day of Surgery  Do not apply any lotions/deoderants the morning of surgery.  Please wear clean clothes to the hospital/surgery center.     Please read over the following fact sheets that you were given. Pain Booklet, Coughing and Deep Breathing and Surgical Site Infection Prevention

## 2016-01-06 ENCOUNTER — Encounter (HOSPITAL_COMMUNITY): Payer: Self-pay | Admitting: Surgery

## 2016-01-06 DIAGNOSIS — C73 Malignant neoplasm of thyroid gland: Secondary | ICD-10-CM | POA: Diagnosis present

## 2016-01-06 NOTE — H&P (Signed)
General Surgery [facility]  Terrace Arabia. Failing DOB: November 16, 1970 Single / Language: Cleophus Molt / Race: White Female  History of Present Illness  The patient is a 45 year old female who presents with a thyroid nodule.  Patient is referred by Dr. Uvaldo Rising for evaluation of newly diagnosed left thyroid nodule. Patient was being seen for her regular gynecologic examination. On physical examination, Dr. Toney Rakes noted a left-sided thyroid nodule. Patient subsequently underwent a thyroid ultrasound on October 27, 2015. This showed a normal right thyroid lobe. Left lobe was normal in size but contained a 2.3 cm heterogeneous solid mass as well as a smaller 8 mm solid nodule. There was no lymphadenopathy. Biopsy of the dominant nodule was recommended. Patient has no prior history of thyroid disease. She has never been on thyroid medication. She has had no prior head or neck surgery. She denies tremors or palpitations. TSH level is normal at 2.8. There is no family history of thyroid disease or thyroid cancer. There is no family history of other endocrine neoplasms.   Other Problems No pertinent past medical history  Diagnostic Studies History Mammogram within last year  Allergies No Known Drug Allergies01/02/2016  Medication History No Current Medications Medications Reconciled  Social History No caffeine use No drug use Tobacco use Never smoker.  Family History Breast Cancer Family Members In General.  Pregnancy / Birth History Age at menarche 81 years. Contraceptive History Contraceptive implant. Gravida 4 Maternal age 48-20 Para 3 Regular periods  Review of Systems General Not Present- Appetite Loss, Chills, Fatigue, Fever, Night Sweats, Weight Gain and Weight Loss. Skin Not Present- Change in Wart/Mole, Dryness, Hives, Jaundice, New Lesions, Non-Healing Wounds, Rash and Ulcer. HEENT Not Present- Earache, Hearing Loss,  Hoarseness, Nose Bleed, Oral Ulcers, Ringing in the Ears, Seasonal Allergies, Sinus Pain, Sore Throat, Visual Disturbances, Wears glasses/contact lenses and Yellow Eyes. Respiratory Not Present- Bloody sputum, Chronic Cough, Difficulty Breathing, Snoring and Wheezing. Breast Not Present- Breast Mass, Breast Pain, Nipple Discharge and Skin Changes. Cardiovascular Not Present- Chest Pain, Difficulty Breathing Lying Down, Leg Cramps, Palpitations, Rapid Heart Rate, Shortness of Breath and Swelling of Extremities. Gastrointestinal Not Present- Abdominal Pain, Bloating, Bloody Stool, Change in Bowel Habits, Chronic diarrhea, Constipation, Difficulty Swallowing, Excessive gas, Gets full quickly at meals, Hemorrhoids, Indigestion, Nausea, Rectal Pain and Vomiting. Female Genitourinary Not Present- Frequency, Nocturia, Painful Urination, Pelvic Pain and Urgency. Musculoskeletal Not Present- Back Pain, Joint Pain, Joint Stiffness, Muscle Pain, Muscle Weakness and Swelling of Extremities. Neurological Not Present- Decreased Memory, Fainting, Headaches, Numbness, Seizures, Tingling, Tremor, Trouble walking and Weakness. Psychiatric Not Present- Anxiety, Bipolar, Change in Sleep Pattern, Depression, Fearful and Frequent crying. Endocrine Not Present- Cold Intolerance, Excessive Hunger, Hair Changes, Heat Intolerance, Hot flashes and New Diabetes. Hematology Not Present- Easy Bruising, Excessive bleeding, Gland problems, HIV and Persistent Infections.  Vitals Weight: 113 lb Height: 62in Body Surface Area: 1.5 m Body Mass Index: 20.67 kg/m  Temp.: 97.67F(Temporal)  Pulse: 73 (Regular)  BP: 114/80 (Sitting, Left Arm, Standard)  Physical Exam  General - appears comfortable, no distress; not diaphorectic  HEENT - normocephalic; sclerae clear, gaze conjugate; mucous membranes moist, dentition good; voice normal  Neck - asymmetric on extension; no palpable anterior or posterior cervical  adenopathy; right thyroid lobe is without palpable abnormality; left thyroid lobe has a dominant smooth mass measuring approximately 2.5 cm in the mid left lobe, mobile, nontender  Chest - clear bilaterally without rhonchi, rales, or wheeze  Cor - regular rhythm  with normal rate; no significant murmur  Ext - non-tender without significant edema or lymphedema  Neuro - grossly intact; no tremor   Assessment & Plan  THYROID NODULE (E04.1)  Follow Up - Call [facility] office after tests / studies done to discuss further plans  Patient presents with newly diagnosed thyroid nodule found by her gynecologist. Patient is given written literature in Spanish to review at home.  After review of her records and performing physical examination, I have recommended proceeding with ultrasound-guided fine-needle aspiration biopsy of the dominant left thyroid nodule. We will arrange for this to be done in the immediate future. I will contact the patient with her results when they are available.  If the biopsy is benign, then I would recommend a follow-up thyroid ultrasound in 6 months to document stability. If the biopsy shows atypia or evidence of malignancy, then we will consider thyroid lobectomy for definitive diagnosis and management. Patient denied discuss this today and I provided her with literature to review at home.  Patient will undergo ultrasound-guided fine-needle aspiration biopsy and I will contact her with results as soon as they are available.  Addendum:  History of Present Illness  Patient returns to discuss the results of her fine-needle aspiration biopsy. This was performed on November 26, 2015. Findings are consistent with papillary thyroid carcinoma. Ultrasound shows at least 2 nodules in the left thyroid lobe. I have recommended proceeding with total thyroidectomy. Patient may or may not require radioactive iodine treatment following surgery. She presents today accompanied by a friend  to act as a Optometrist. She refused telephone translator offered by our office.  Vitals  Weight: 113 lb Height: 62in Body Surface Area: 1.5 m Body Mass Index: 20.67 kg/m  Pulse: 75 (Regular)  BP: 124/76 (Sitting, Left Arm, Standard)  Physical Exam  Limited examination  HEENT is normal with the exception of a firm palpable mass in the mid left thyroid lobe measuring approximately 2 cm in size. This is mobile and nontender. There is no associated lymphadenopathy. Right thyroid lobe was without palpable abnormality.  Assessment & Plan  PAPILLARY THYROID CARCINOMA (C73)  Patient has biopsy-proven papillary thyroid carcinoma arising in a 2.3 cm tumor in the left thyroid lobe. There is a smaller adjacent nodule measuring 8 mm which has not been sampled.  Patient presents today accompanied by a friend acted as Optometrist at her request. She refused a Hotel manager offered by our office.  I discussed at length the indications for surgery. We discussed thyroid lobectomy versus total thyroidectomy. We discussed the potential need for radioactive iodine treatment. We discussed the procedure, the hospital stay, and the postoperative recovery.  Patient explicitly wishes no information to go to any other family members or friends. No one else is to know her diagnosis except for the patient. I explained that we could honor this request for privacy here in our office. We will notify the medical staff of the hospitals but she should take care to remind them at her visits or come alone to these visits if at all possible.  I have recommended total thyroidectomy for treatment of her biopsy-proven thyroid carcinoma. Risk and benefits have been reviewed with the patient. She understands and agrees to proceed. We will arrange for endocrinology consultation following her surgical procedure. I have recommended Dr. Philemon Kingdom.  The risks and benefits of the procedure have been discussed  at length with the patient. The patient understands the proposed procedure, potential alternative treatments, and the course of recovery  to be expected. All of the patient's questions have been answered at this time. The patient wishes to proceed with surgery.  Earnstine Regal, MD, [facility] Office: [phone]

## 2016-01-07 MED ORDER — CEFAZOLIN SODIUM-DEXTROSE 2-3 GM-% IV SOLR
2.0000 g | INTRAVENOUS | Status: AC
  Administered 2016-01-08: 2 g via INTRAVENOUS
  Filled 2016-01-07: qty 50

## 2016-01-08 ENCOUNTER — Encounter (HOSPITAL_COMMUNITY)
Admission: RE | Disposition: A | Discharge status: Home / Self Care | Payer: Self-pay | Source: Ambulatory Visit | Attending: Surgery

## 2016-01-08 ENCOUNTER — Ambulatory Visit (HOSPITAL_COMMUNITY): Payer: BLUE CROSS/BLUE SHIELD | Admitting: Anesthesiology

## 2016-01-08 ENCOUNTER — Encounter (HOSPITAL_COMMUNITY): Payer: Self-pay

## 2016-01-08 ENCOUNTER — Observation Stay (HOSPITAL_COMMUNITY)
Admission: RE | Admit: 2016-01-08 | Discharge: 2016-01-09 | Disposition: A | Discharge status: Home / Self Care | Payer: BLUE CROSS/BLUE SHIELD | Source: Ambulatory Visit | Attending: Surgery | Admitting: Surgery

## 2016-01-08 DIAGNOSIS — C73 Malignant neoplasm of thyroid gland: Secondary | ICD-10-CM | POA: Diagnosis not present

## 2016-01-08 HISTORY — PX: THYROIDECTOMY: SHX17

## 2016-01-08 SURGERY — THYROIDECTOMY
Anesthesia: General | Site: Neck

## 2016-01-08 MED ORDER — FENTANYL CITRATE (PF) 100 MCG/2ML IJ SOLN
INTRAMUSCULAR | Status: DC | PRN
  Administered 2016-01-08: 50 ug via INTRAVENOUS
  Administered 2016-01-08: 100 ug via INTRAVENOUS
  Administered 2016-01-08 (×2): 50 ug via INTRAVENOUS

## 2016-01-08 MED ORDER — ROCURONIUM BROMIDE 100 MG/10ML IV SOLN
INTRAVENOUS | Status: DC | PRN
  Administered 2016-01-08: 40 mg via INTRAVENOUS
  Administered 2016-01-08: 10 mg via INTRAVENOUS

## 2016-01-08 MED ORDER — CALCIUM CARBONATE-VITAMIN D 500-200 MG-UNIT PO TABS: 2.0000 | ORAL_TABLET | Freq: Three times a day (TID) | ORAL | Status: DC

## 2016-01-08 MED ORDER — HYDROCODONE-ACETAMINOPHEN 5-325 MG PO TABS: 1.0000 | ORAL_TABLET | ORAL | Status: DC | PRN

## 2016-01-08 MED ORDER — ONDANSETRON HCL 4 MG/2ML IJ SOLN
4.0000 mg | Freq: Four times a day (QID) | INTRAMUSCULAR | Status: DC | PRN
  Administered 2016-01-08: 4 mg via INTRAVENOUS
  Filled 2016-01-08 (×2): qty 2

## 2016-01-08 MED ORDER — HYDROMORPHONE HCL 1 MG/ML IJ SOLN
1.0000 mg | INTRAMUSCULAR | Status: DC | PRN
  Administered 2016-01-08: 1 mg via INTRAVENOUS
  Filled 2016-01-08: qty 1

## 2016-01-08 MED ORDER — LIDOCAINE HCL (CARDIAC) 20 MG/ML IV SOLN
INTRAVENOUS | Status: DC | PRN
  Administered 2016-01-08: 20 mg via INTRAVENOUS

## 2016-01-08 MED ORDER — ACETAMINOPHEN 325 MG PO TABS: 650.0000 mg | ORAL_TABLET | Freq: Four times a day (QID) | ORAL | Status: DC | PRN

## 2016-01-08 MED ORDER — PROMETHAZINE HCL 25 MG/ML IJ SOLN
6.2500 mg | INTRAMUSCULAR | Status: DC | PRN
Start: 2016-01-08 — End: 2016-01-08

## 2016-01-08 MED ORDER — ACETAMINOPHEN 650 MG RE SUPP: 650.0000 mg | Freq: Four times a day (QID) | RECTAL | Status: DC | PRN

## 2016-01-08 MED ORDER — MIDAZOLAM HCL 2 MG/2ML IJ SOLN
INTRAMUSCULAR | Status: AC
  Filled 2016-01-08: qty 2

## 2016-01-08 MED ORDER — PROPOFOL 10 MG/ML IV BOLUS
INTRAVENOUS | Status: DC | PRN
  Administered 2016-01-08: 120 mg via INTRAVENOUS

## 2016-01-08 MED ORDER — SYNTHROID 88 MCG PO TABS: 88.0000 ug | ORAL_TABLET | Freq: Every day | ORAL | Status: DC

## 2016-01-08 MED ORDER — FENTANYL CITRATE (PF) 250 MCG/5ML IJ SOLN
INTRAMUSCULAR | Status: AC
  Filled 2016-01-08: qty 5

## 2016-01-08 MED ORDER — NEOSTIGMINE METHYLSULFATE 10 MG/10ML IV SOLN
INTRAVENOUS | Status: DC | PRN
  Administered 2016-01-08: 3 mg via INTRAVENOUS

## 2016-01-08 MED ORDER — ROCURONIUM BROMIDE 50 MG/5ML IV SOLN
INTRAVENOUS | Status: AC
  Filled 2016-01-08: qty 1

## 2016-01-08 MED ORDER — HEMOSTATIC AGENTS (NO CHARGE) OPTIME
TOPICAL | Status: DC | PRN
  Administered 2016-01-08: 1

## 2016-01-08 MED ORDER — 0.9 % SODIUM CHLORIDE (POUR BTL) OPTIME
TOPICAL | Status: DC | PRN
  Administered 2016-01-08: 1000 mL

## 2016-01-08 MED ORDER — LACTATED RINGERS IV SOLN
INTRAVENOUS | Status: DC | PRN
  Administered 2016-01-08: 07:00:00 via INTRAVENOUS

## 2016-01-08 MED ORDER — HYDROMORPHONE HCL 1 MG/ML IJ SOLN: 0.2500 mg | INTRAMUSCULAR | Status: DC | PRN

## 2016-01-08 MED ORDER — PROPOFOL 10 MG/ML IV BOLUS
INTRAVENOUS | Status: AC
  Filled 2016-01-08: qty 20

## 2016-01-08 MED ORDER — ONDANSETRON 4 MG PO TBDP
4.0000 mg | ORAL_TABLET | Freq: Four times a day (QID) | ORAL | Status: DC | PRN
  Filled 2016-01-08: qty 1

## 2016-01-08 MED ORDER — PHENYLEPHRINE 40 MCG/ML (10ML) SYRINGE FOR IV PUSH (FOR BLOOD PRESSURE SUPPORT)
PREFILLED_SYRINGE | INTRAVENOUS | Status: AC
  Filled 2016-01-08: qty 10

## 2016-01-08 MED ORDER — CALCIUM CARBONATE 1250 (500 CA) MG PO TABS
2.0000 | ORAL_TABLET | Freq: Three times a day (TID) | ORAL | Status: DC
  Administered 2016-01-08 – 2016-01-09 (×2): 1000 mg via ORAL
  Filled 2016-01-08 (×3): qty 1
  Filled 2016-01-08: qty 2

## 2016-01-08 MED ORDER — KCL IN DEXTROSE-NACL 20-5-0.45 MEQ/L-%-% IV SOLN
INTRAVENOUS | Status: DC
  Administered 2016-01-08: 16:00:00 via INTRAVENOUS
  Filled 2016-01-08 (×2): qty 1000

## 2016-01-08 MED ORDER — ONDANSETRON HCL 4 MG/2ML IJ SOLN
INTRAMUSCULAR | Status: DC | PRN
  Administered 2016-01-08: 4 mg via INTRAVENOUS

## 2016-01-08 MED ORDER — MIDAZOLAM HCL 2 MG/2ML IJ SOLN: 0.5000 mg | Freq: Once | INTRAMUSCULAR | Status: DC | PRN

## 2016-01-08 MED ORDER — PHENYLEPHRINE HCL 10 MG/ML IJ SOLN
INTRAMUSCULAR | Status: DC | PRN
  Administered 2016-01-08 (×2): 80 ug via INTRAVENOUS

## 2016-01-08 MED ORDER — DEXAMETHASONE SODIUM PHOSPHATE 4 MG/ML IJ SOLN
INTRAMUSCULAR | Status: DC | PRN
  Administered 2016-01-08: 4 mg via INTRAVENOUS

## 2016-01-08 MED ORDER — LIDOCAINE HCL (CARDIAC) 20 MG/ML IV SOLN
INTRAVENOUS | Status: AC
  Filled 2016-01-08: qty 5

## 2016-01-08 MED ORDER — MEPERIDINE HCL 25 MG/ML IJ SOLN: 6.2500 mg | INTRAMUSCULAR | Status: DC | PRN

## 2016-01-08 MED ORDER — GLYCOPYRROLATE 0.2 MG/ML IJ SOLN
INTRAMUSCULAR | Status: DC | PRN
  Administered 2016-01-08: 0.4 mg via INTRAVENOUS

## 2016-01-08 SURGICAL SUPPLY — 57 items
APL SKNCLS STERI-STRIP NONHPOA (GAUZE/BANDAGES/DRESSINGS) ×1
ATTRACTOMAT 16X20 MAGNETIC DRP (DRAPES) ×2 IMPLANT
BENZOIN TINCTURE PRP APPL 2/3 (GAUZE/BANDAGES/DRESSINGS) ×2 IMPLANT
BLADE SURG 10 STRL SS (BLADE) ×2 IMPLANT
BLADE SURG 15 STRL LF DISP TIS (BLADE) ×1 IMPLANT
BLADE SURG 15 STRL SS (BLADE) ×2
BLADE SURG ROTATE 9660 (MISCELLANEOUS) IMPLANT
CANISTER SUCTION 2500CC (MISCELLANEOUS) ×2 IMPLANT
CHLORAPREP W/TINT 10.5 ML (MISCELLANEOUS) ×2 IMPLANT
CLIP TI MEDIUM 24 (CLIP) ×2 IMPLANT
CLIP TI WIDE RED SMALL 24 (CLIP) ×2 IMPLANT
CLIP TI WIDE RED SMALL 6 (CLIP) ×1 IMPLANT
CONT SPEC 4OZ CLIKSEAL STRL BL (MISCELLANEOUS) IMPLANT
COVER SURGICAL LIGHT HANDLE (MISCELLANEOUS) ×2 IMPLANT
CRADLE DONUT ADULT HEAD (MISCELLANEOUS) ×2 IMPLANT
DRAPE LAPAROTOMY T 98X78 PEDS (DRAPES) ×2 IMPLANT
DRAPE UTILITY XL STRL (DRAPES) ×2 IMPLANT
ELECT CAUTERY BLADE 6.4 (BLADE) ×2 IMPLANT
ELECT REM PT RETURN 9FT ADLT (ELECTROSURGICAL) ×2
ELECTRODE REM PT RTRN 9FT ADLT (ELECTROSURGICAL) ×1 IMPLANT
GAUZE SPONGE 4X4 12PLY STRL (GAUZE/BANDAGES/DRESSINGS) ×2 IMPLANT
GAUZE SPONGE 4X4 16PLY XRAY LF (GAUZE/BANDAGES/DRESSINGS) ×2 IMPLANT
GLOVE BIO SURGEON STRL SZ7 (GLOVE) ×1 IMPLANT
GLOVE BIO SURGEON STRL SZ7.5 (GLOVE) ×1 IMPLANT
GLOVE BIOGEL PI IND STRL 6.5 (GLOVE) IMPLANT
GLOVE BIOGEL PI IND STRL 7.0 (GLOVE) IMPLANT
GLOVE BIOGEL PI IND STRL 7.5 (GLOVE) IMPLANT
GLOVE BIOGEL PI INDICATOR 6.5 (GLOVE) ×2
GLOVE BIOGEL PI INDICATOR 7.0 (GLOVE) ×1
GLOVE BIOGEL PI INDICATOR 7.5 (GLOVE) ×1
GLOVE SURG ORTHO 8.0 STRL STRW (GLOVE) ×2 IMPLANT
GLOVE SURG SS PI 6.0 STRL IVOR (GLOVE) ×1 IMPLANT
GOWN STRL REUS W/ TWL LRG LVL3 (GOWN DISPOSABLE) ×2 IMPLANT
GOWN STRL REUS W/ TWL XL LVL3 (GOWN DISPOSABLE) ×1 IMPLANT
GOWN STRL REUS W/TWL LRG LVL3 (GOWN DISPOSABLE) ×4
GOWN STRL REUS W/TWL XL LVL3 (GOWN DISPOSABLE) ×2
HEMOSTAT SURGICEL 2X4 FIBR (HEMOSTASIS) ×2 IMPLANT
KIT BASIN OR (CUSTOM PROCEDURE TRAY) ×2 IMPLANT
KIT ROOM TURNOVER OR (KITS) ×2 IMPLANT
LIGHT WAVEGUIDE WIDE FLAT (MISCELLANEOUS) ×1 IMPLANT
NS IRRIG 1000ML POUR BTL (IV SOLUTION) ×2 IMPLANT
PACK SURGICAL SETUP 50X90 (CUSTOM PROCEDURE TRAY) ×2 IMPLANT
PAD ARMBOARD 7.5X6 YLW CONV (MISCELLANEOUS) ×2 IMPLANT
PENCIL BUTTON HOLSTER BLD 10FT (ELECTRODE) ×2 IMPLANT
SHEARS HARMONIC 9CM CVD (BLADE) ×2 IMPLANT
SPECIMEN JAR MEDIUM (MISCELLANEOUS) IMPLANT
SPONGE INTESTINAL PEANUT (DISPOSABLE) ×2 IMPLANT
STRIP CLOSURE SKIN 1/2X4 (GAUZE/BANDAGES/DRESSINGS) ×2 IMPLANT
SUT MNCRL AB 4-0 PS2 18 (SUTURE) ×3 IMPLANT
SUT SILK 2 0 (SUTURE) ×2
SUT SILK 2-0 18XBRD TIE 12 (SUTURE) ×1 IMPLANT
SUT VIC AB 3-0 SH 18 (SUTURE) ×2 IMPLANT
SYR BULB 3OZ (MISCELLANEOUS) ×2 IMPLANT
TAPE CLOTH SURG 4X10 WHT LF (GAUZE/BANDAGES/DRESSINGS) ×1 IMPLANT
TOWEL OR 17X24 6PK STRL BLUE (TOWEL DISPOSABLE) ×2 IMPLANT
TOWEL OR 17X26 10 PK STRL BLUE (TOWEL DISPOSABLE) ×2 IMPLANT
TUBE CONNECTING 12X1/4 (SUCTIONS) ×2 IMPLANT

## 2016-01-08 NOTE — Transfer of Care (Signed)
Immediate Anesthesia Transfer of Care Note  Patient: Molly Prince  Procedure(s) Performed: Procedure(s): TOTAL THYROIDECTOMY (N/A)  Patient Location: PACU  Anesthesia Type:General  Level of Consciousness: awake  Airway & Oxygen Therapy: Patient Spontanous Breathing and Patient connected to nasal cannula oxygen  Post-op Assessment: Report given to RN and Post -op Vital signs reviewed and stable  Post vital signs: Reviewed and stable  Last Vitals:  Filed Vitals:   01/08/16 0647 01/08/16 0935  BP: 102/73   Pulse: 76 81  Temp: 36.8 C 36.9 C  Resp: 18 15    Complications: No apparent anesthesia complications

## 2016-01-08 NOTE — Progress Notes (Signed)
Patient reiterates that she does not want anyone to use the word 'cancer' around any family or persons that are in the room and requests privacy when it comes to this matter.  She says referring to the nodule is okay.

## 2016-01-08 NOTE — Anesthesia Postprocedure Evaluation (Signed)
Anesthesia Post Note  Patient: Molly Prince  Procedure(s) Performed: Procedure(s) (LRB): TOTAL THYROIDECTOMY (N/A)  Patient location during evaluation: PACU Anesthesia Type: General Level of consciousness: awake and alert, oriented and patient cooperative Pain management: pain level controlled Vital Signs Assessment: post-procedure vital signs reviewed and stable Respiratory status: spontaneous breathing, nonlabored ventilation and respiratory function stable Cardiovascular status: blood pressure returned to baseline and stable Postop Assessment: no signs of nausea or vomiting Anesthetic complications: no    Last Vitals:  Filed Vitals:   01/08/16 1033 01/08/16 1100  BP: 112/74   Pulse: 62 71  Temp:  36.1 C  Resp: 18 17    Last Pain:  Filed Vitals:   01/08/16 1103  PainSc: 0-No pain                 Joniece Smotherman,E. Mahki Spikes

## 2016-01-08 NOTE — Anesthesia Procedure Notes (Signed)
Procedure Name: Intubation Date/Time: 01/08/2016 7:39 AM Performed by: Manus Gunning, Lillyrose Reitan J Pre-anesthesia Checklist: Patient identified, Timeout performed, Emergency Drugs available, Suction available and Patient being monitored Patient Re-evaluated:Patient Re-evaluated prior to inductionOxygen Delivery Method: Circle system utilized Preoxygenation: Pre-oxygenation with 100% oxygen Intubation Type: IV induction Ventilation: Mask ventilation without difficulty Laryngoscope Size: Mac and 3 Grade View: Grade I Tube type: Oral Tube size: 7.0 mm Number of attempts: 1 Placement Confirmation: ETT inserted through vocal cords under direct vision,  breath sounds checked- equal and bilateral and positive ETCO2 Secured at: 21 cm Tube secured with: Tape Dental Injury: Teeth and Oropharynx as per pre-operative assessment

## 2016-01-08 NOTE — Op Note (Signed)
Procedure Note  Pre-operative Diagnosis:  Papillary thyroid carcinoma  Post-operative Diagnosis:  same  Surgeon:  Earnstine Regal, MD, FACS  Assistant:  Sharyn Dross, RNFA   Procedure:  Total thyroidectomy  Anesthesia:  General  Estimated Blood Loss:  minimal  Drains: none         Specimen: thyroid to pathology  Indications:  Patient is referred by Dr. Uvaldo Rising for evaluation of newly diagnosed left thyroid nodule. Patient was being seen for her regular gynecologic examination. On physical examination, Dr. Toney Rakes noted a left-sided thyroid nodule. Patient subsequently underwent a thyroid ultrasound on October 27, 2015. This showed a normal right thyroid lobe. Left lobe was normal in size but contained a 2.3 cm heterogeneous solid mass as well as a smaller 8 mm solid nodule. There was no lymphadenopathy. Biopsy shows papillary thyroid carcinoma.  Patient now comes to surgery for total thyroidectomy.  Procedure Details: Procedure was done in OR #2 at the [facility].  The patient was brought to the operating room and placed in a supine position on the operating room table.  Following administration of general anesthesia, the patient was positioned and then prepped and draped in the usual aseptic fashion.  After ascertaining that an adequate level of anesthesia had been achieved, a Kocher incision was made with #15 blade.  Dissection was carried through subcutaneous tissues and platysma. Hemostasis was achieved with the electrocautery.  Skin flaps were elevated cephalad and caudad from the thyroid notch to the sternal notch.  The Mahorner self-retaining retractor was placed for exposure.  Strap muscles were incised in the midline and dissection was begun on the left side.  Strap muscles were reflected laterally.  Left thyroid lobe was enlarged with a firm nodule located centrally.  The left lobe was gently mobilized with blunt dissection.  Superior pole vessels were  dissected out and divided individually between small and medium Ligaclips with the Harmonic scalpel.  The thyroid lobe was rolled anteriorly.  Branches of the inferior thyroid artery were divided between small Ligaclips with the Harmonic scalpel.  Inferior venous tributaries were divided between Ligaclips.  Both the superior and inferior parathyroid glands were identified and preserved on their vascular pedicles.  The recurrent laryngeal nerve was identified and preserved along its course.  The ligament of Gwenlyn Found was released with the electrocautery and the gland was mobilized onto the anterior trachea. Isthmus was mobilized across the midline.  There was a small pyramidal lobe present which was dissected off of the thyroid cartilage and resected en bloc with the isthmus.  Dry pack was placed in the left neck.  Next, the right thyroid lobe was gently mobilized with blunt dissection.  Right thyroid lobe was small without nodules.  Superior pole vessels were dissected out and divided between small and medium Ligaclips with the Harmonic scalpel.  Superior parathyroid was identified and preserved.  Inferior venous tributaries were divided between medium Ligaclips with the Harmonic scalpel.  The right thyroid lobe was rolled anteriorly and the branches of the inferior thyroid artery divided between small Ligaclips.  The right recurrent laryngeal nerve was identified and preserved along its course.  The ligament of Gwenlyn Found was released with the electrocautery.  The right thyroid lobe was mobilized onto the anterior trachea and the remainder of the thyroid was dissected off the anterior trachea and the thyroid was completely excised.  A suture was used to mark the left lobe. The entire thyroid gland was submitted to pathology for review.  The  neck was irrigated with warm saline.  Fibrillar was placed throughout the operative field.  Strap muscles were reapproximated in the midline with interrupted 3-0 Vicryl sutures.   Platysma was closed with interrupted 3-0 Vicryl sutures.  Skin was closed with a running 4-0 Monocryl subcuticular suture.  Wound was washed and dried and benzoin and steri-strips were applied.  Dry gauze dressing was placed.  The patient was awakened from anesthesia and brought to the recovery room.  The patient tolerated the procedure well.   Earnstine Regal, MD, [facility] Office: [phone]

## 2016-01-08 NOTE — Interval H&P Note (Signed)
History and Physical Interval Note:  01/08/2016 7:00 AM  Molly Prince  has presented today for surgery, with the diagnosis of Papillary thyroid carcinoma.   The various methods of treatment have been discussed with the patient and family. After consideration of risks, benefits and other options for treatment, the patient has consented to    Procedure(s): TOTAL THYROIDECTOMY (N/A) as a surgical intervention .    The patient's history has been reviewed, patient examined, no change in status, stable for surgery.  I have reviewed the patient's chart and labs.  Questions were answered to the patient's satisfaction.    Earnstine Regal, MD, [facility] Office: [place]

## 2016-01-08 NOTE — Anesthesia Preprocedure Evaluation (Signed)
Anesthesia Evaluation  Patient identified by MRN, date of birth, ID band Patient awake    Reviewed: Allergy & Precautions, NPO status , Patient's Chart, lab work & pertinent test results  History of Anesthesia Complications Negative for: history of anesthetic complications  Airway Mallampati: I  TM Distance: >3 FB Neck ROM: Full    Dental  (+) Dental Advisory Given, Teeth Intact   Pulmonary neg pulmonary ROS,    breath sounds clear to auscultation       Cardiovascular negative cardio ROS   Rhythm:Regular Rate:Normal     Neuro/Psych negative neurological ROS     GI/Hepatic negative GI ROS, Neg liver ROS,   Endo/Other  negative endocrine ROS  Renal/GU negative Renal ROS     Musculoskeletal   Abdominal   Peds  Hematology negative hematology ROS (+)   Anesthesia Other Findings   Reproductive/Obstetrics negative OB ROS Mirena                             Anesthesia Physical Anesthesia Plan  ASA: I  Anesthesia Plan:    Post-op Pain Management:    Induction: Intravenous  Airway Management Planned: Oral ETT  Additional Equipment:   Intra-op Plan:   Post-operative Plan: Extubation in OR  Informed Consent: I have reviewed the patients History and Physical, chart, labs and discussed the procedure including the risks, benefits and alternatives for the proposed anesthesia with the patient or authorized representative who has indicated his/her understanding and acceptance.   Dental advisory given  Plan Discussed with: CRNA and Surgeon  Anesthesia Plan Comments: (Plan routine monitors, GETA)        Anesthesia Quick Evaluation

## 2016-01-08 NOTE — Discharge Instructions (Signed)
[facility] ° °THYROID & PARATHYROID SURGERY:  POST-OP INSTRUCTIONS ° °Always review your discharge instruction sheet from the facility where your surgery was performed. ° °A prescription for pain medication may be given to you upon discharge.  Take your pain medication as prescribed.  If narcotic pain medicine is not needed, then you may take acetaminophen (Tylenol) or ibuprofen (Advil) as needed. ° °Take your usually prescribed medications unless otherwise directed. ° °If you need a refill on your pain medication, please contact your pharmacy. They will contact our office to request authorization.  Prescriptions will not be processed by our office after 5 pm or on weekends. ° °Start with a light diet upon arrival home, such as soup and crackers or toast.  Be sure to drink plenty of fluids daily.  Resume your normal diet the day after surgery. ° °Most patients will experience some swelling and bruising on the chest and neck area.  Ice packs will help.  Swelling and bruising can take several days to resolve.  ° °It is common to experience some constipation after surgery.  Increasing fluid intake and taking a stool softener will usually help or prevent this problem.  A mild laxative (Milk of Magnesia or Miralax) should be taken according to package directions if there has been no bowel movement after 48 hours. ° °You have steri-strips and a gauze dressing over your incision.  You may remove the gauze bandage on the second day after surgery, and you may shower at that time.  Leave your steri-strips (small skin tapes) in place directly over the incision.  These strips should remain on the skin for 5-7 days and then be removed.  You may get them wet in the shower and pat them dry. ° °You may resume regular (light) daily activities beginning the next day - such as daily self-care, walking, climbing stairs - gradually increasing activities as tolerated.  You may have sexual intercourse when it is  comfortable.  Refrain from any heavy lifting or straining until approved by your doctor.  You may drive when you no longer are taking prescription pain medication, you can comfortably wear a seatbelt, and you can safely maneuver your car and apply brakes. ° °You should see your doctor in the office for a follow-up appointment approximately two to three weeks after your surgery.  Make sure that you call for this appointment within a day or two after you arrive home to insure a convenient appointment time. ° °WHEN TO CALL YOUR DOCTOR: °-- Fever greater than 101.5 °-- Inability to urinate °-- Nausea and/or vomiting - persistent °-- Extreme swelling or bruising °-- Continued bleeding from incision °-- Increased pain, redness, or drainage from the incision °-- Difficulty swallowing or breathing °-- Muscle cramping or spasms °-- Numbness or tingling in hands or around lips ° °The clinic staff is available to answer your questions during regular business hours.  Please don’t hesitate to call and ask to speak to one of the nurses if you have concerns. ° °Eknoor Novack M. Freeland Pracht, MD, FACS °General & Endocrine Surgery °[facility] °Office: [phone] ° °Website: [email or web address] ° ° °

## 2016-01-08 NOTE — Progress Notes (Signed)
Pt admitted from pacu this pm s/p thyroid nodule surgey. Alert, oriented and able to voice needs. Pt does not wish for her condition to be discussed with anyone except for the fact that she had a thyroid nodule. No further details are to be issued to family members at this time

## 2016-01-09 DIAGNOSIS — C73 Malignant neoplasm of thyroid gland: Secondary | ICD-10-CM | POA: Diagnosis not present

## 2016-01-09 LAB — BASIC METABOLIC PANEL
Anion gap: 7 (ref 5–15)
BUN: 6 mg/dL (ref 6–20)
CO2: 25 mmol/L (ref 22–32)
Calcium: 7.8 mg/dL — ABNORMAL LOW (ref 8.9–10.3)
Chloride: 108 mmol/L (ref 101–111)
Creatinine, Ser: 0.55 mg/dL (ref 0.44–1.00)
GFR calc Af Amer: >60 mL/min (ref >60)
GFR calc non Af Amer: >60 mL/min (ref >60)
Glucose, Bld: 94 mg/dL (ref 65–99)
Potassium: 3.5 mmol/L (ref 3.5–5.1)
Sodium: 140 mmol/L (ref 135–145)

## 2016-01-09 NOTE — Discharge Summary (Signed)
Physician Discharge Summary  Molly CREMEANS [id number] DOB: 1971/03/18 DOA: 01/08/2016  PCP: No primary care provider on file.  Consultation: none   Admit date: 01/08/2016 Discharge date: 01/09/2016  Recommendations for Outpatient Follow-up:   Follow-up Information    Follow up with Earnstine Regal, MD. Schedule an appointment as soon as possible for a visit in 3 weeks.   Specialty:  General Surgery   Why:  For wound re-check   Contact information:   [place] [address] [phone]      Discharge Diagnoses:  1. Papillary thyroid carcinoma    Surgical Procedure: total thyroidectomy---Dr. Harlow Asa  Discharge Condition: stable Disposition: home  Diet recommendation: regular   Filed Weights   01/08/16 0647  Weight: 50.301 kg (110 lb 14.3 oz)     Filed Vitals:   01/08/16 2118 01/09/16 0710  BP: 98/59 96/57  Pulse: 65 63  Temp:  98.2 F (36.8 C)  Resp: 18 18     Hospital Course:  Molly Prince presented for a total thyroidectomy.  Molly Prince was admitted overnight for monitoring.  Molly Prince had an uneventful hospital stay.  On POD#1 tolerating POs, VSS, afebrile, no pain.  Molly Prince was therefore felt stable for discharge.  I discussed the importance of taking calcium and vitamin d.  Medication risks, benefits and therapeutic alternatives were reviewed with the patient.  Molly Prince verbalizes understanding.  Molly Prince knows to follow up on the 16th with Dr. Harlow Asa.    General appearance: alert and oriented. Calm and cooperative No acute distress. VSS. Afebrile.  Neck: dressing is c/d/i.  Resp: clear to auscultation bilaterally  Cardio: S1S1 RRR without murmurs or gallops. No edema. GI: soft round and nontender. +BS x4 quadrants. No organomegaly, hernias or masses.  Pulses: +2 bilateral distal pulses without cyanosis  Neurologic: Mental status: Alert, oriented, thought content appropriate    Discharge Instructions     Medication List    TAKE these medications       calcium-vitamin D 500-200 MG-UNIT tablet  Commonly known as:  OSCAL WITH D  Take 2 tablets by mouth 3 (three) times daily.     HYDROcodone-acetaminophen 5-325 MG tablet  Commonly known as:  NORCO/VICODIN  Take 1-2 tablets by mouth every 4 (four) hours as needed for moderate pain.     SYNTHROID 88 MCG tablet  Generic drug:  levothyroxine  Take 1 tablet (88 mcg total) by mouth daily before breakfast.           Follow-up Information    Follow up with Earnstine Regal, MD. Schedule an appointment as soon as possible for a visit in 3 weeks.   Specialty:  General Surgery   Why:  For wound re-check   Contact information:   [address] [place] [address] [phone]        The results of significant diagnostics from this hospitalization (including imaging, microbiology, ancillary and laboratory) are listed below for reference.    Significant Diagnostic Studies: Dg Chest 2 View  01/01/2016  CLINICAL DATA:  45 year old female preoperative study for papillary thyroid carcinoma treatment. Initial encounter. EXAM: CHEST  2 VIEW COMPARISON:  Ultrasound-guided thyroid biopsy 11/26/2015 FINDINGS: Lung volumes are within normal limits. Normal cardiac size and mediastinal contours. Visualized tracheal air column is within normal limits. The lungs are clear. No pneumothorax, pleural effusion, or pulmonary nodule. Negative visualized osseous structures. IMPRESSION: Negative, no acute cardiopulmonary abnormality. Electronically Signed   By: Genevie Ann M.D.   On:  01/01/2016 14:05    Microbiology: No results found for this or any previous visit (from the past 240 hour(s)).   Labs: Basic Metabolic Panel:  Recent Labs Lab 01/09/16 0619  NA 140  K 3.5  CL 108  CO2 25  GLUCOSE 94  BUN 6  CREATININE 0.55  CALCIUM 7.8*   Liver Function Tests: No results for input(s): AST, ALT, ALKPHOS, BILITOT, PROT, ALBUMIN in the last 168 hours. No results for input(s): LIPASE, AMYLASE in  the last 168 hours. No results for input(s): AMMONIA in the last 168 hours. CBC: No results for input(s): WBC, NEUTROABS, HGB, HCT, MCV, PLT in the last 168 hours. Cardiac Enzymes: No results for input(s): CKTOTAL, CKMB, CKMBINDEX, TROPONINI in the last 168 hours. BNP: BNP (last 3 results) No results for input(s): BNP in the last 8760 hours.  ProBNP (last 3 results) No results for input(s): PROBNP in the last 8760 hours.  CBG: No results for input(s): GLUCAP in the last 168 hours.  Principal Problem:   Papillary thyroid carcinoma ([place])   Time coordinating discharge: <30 mins   Signed:  Ophie Burrowes, ANP-BC

## 2016-01-09 NOTE — Progress Notes (Signed)
Patient discharged to home. Discharge instructions were reviewed with patient, Copy of AVS was given to patient. Patient family member transporting her home via private vehicle. Patient agreed and verbalized understanding. No further questions at this moment.  Molly Prince n 10:35 AM 01/09/2016

## 2016-01-11 ENCOUNTER — Encounter (HOSPITAL_COMMUNITY): Payer: Self-pay | Admitting: Surgery

## 2016-02-17 ENCOUNTER — Encounter: Payer: Self-pay | Admitting: Internal Medicine

## 2016-03-07 ENCOUNTER — Other Ambulatory Visit (INDEPENDENT_AMBULATORY_CARE_PROVIDER_SITE_OTHER): Payer: BLUE CROSS/BLUE SHIELD

## 2016-03-07 ENCOUNTER — Ambulatory Visit (INDEPENDENT_AMBULATORY_CARE_PROVIDER_SITE_OTHER): Payer: BLUE CROSS/BLUE SHIELD | Admitting: Internal Medicine

## 2016-03-07 ENCOUNTER — Encounter: Payer: Self-pay | Admitting: Internal Medicine

## 2016-03-07 VITALS — BP 112/60 | HR 70 | Temp 98.0°F | Resp 12 | Ht 60.0 in | Wt 102.6 lb

## 2016-03-07 DIAGNOSIS — Z1321 Encounter for screening for nutritional disorder: Secondary | ICD-10-CM

## 2016-03-07 DIAGNOSIS — E890 Postprocedural hypothyroidism: Secondary | ICD-10-CM | POA: Diagnosis not present

## 2016-03-07 LAB — VITAMIN D 25 HYDROXY (VIT D DEFICIENCY, FRACTURES): VITD: 30.1 ng/mL (ref 30.00–100.00)

## 2016-03-07 LAB — T4, FREE: Free T4: 1.11 ng/dL (ref 0.60–1.60)

## 2016-03-07 LAB — TSH: TSH: 0.35 u[IU]/mL (ref 0.35–4.50)

## 2016-03-07 NOTE — Patient Instructions (Addendum)
Please go to the [place] office for labs.  We will try to arrange the radioactive iodine treatment in July. We will do this with Thyrogen (TSH injection) as discussed.  Take the thyroid hormone every day, with water, at least 30 minutes before breakfast, separated by at least 4 hours from: - acid reflux medications - calcium - iron - multivitamins  Please continue Synthroid 88 mcg daily.  Please return in 4 months.

## 2016-03-07 NOTE — Progress Notes (Signed)
Patient ID: Molly Prince, female   DOB: 09/03/1971, 45 y.o.   MRN: [id number]   HPI  Molly Prince is a 45 y.o.-year-old female, referred by Dr. Harlow Asa, for management of thyroid cancer and postsurgical hypothyroidism.  Pt. has been dx with thyroid cancer in 01/2016:  10/27/2015: Thyroid ultrasound:  Right thyroid lobe Measurements: 3.9 x 1.0 x 1.1 cm. No nodules visualized.  Left thyroid lobe Measurements: 3.5 x 1.8 x 1.8 cm. 2.3 x 1.8 x 1.8 cm heterogeneous solid mass within the left thyroid lobe. Smaller adjacent 8 mm solid nodule.  Isthmus Thickness: 2 mm in thickness. No nodules visualized. 11/26/2015: FNA: PTC (Bethesda category VI) 01/08/2016: Total thyroidectomy-Final pathology: Diagnosis Thyroid, thyroidectomy - PAPILLARY THYROID CARCINOMA, 2.4 CM. - CARCINOMA IS CONFINED TO THE THYROID. - THE SURGICAL RESECTION MARGINS ARE NEGATIVE FOR CARCINOMA. - SEE ONCOLOGY TABLE BELOW. Microscopic Comment THYROID Specimen: Thyroid. Procedure (including lymph node sampling if applicable): Total thyroidectomy. Specimen Integrity (intact/fragmented): Intact. Tumor focality: Unifocal Dominant tumor: Maximum tumor size (cm): 2.4 cm (gross measurement). Tumor laterality: Left lobe. Histologic type (including subtype and/or unique features as applicable): Papillary thyroid carcinoma. Tumor capsule: Not present. Extrathyroidal extension: Not identified. Capsular invasion with degree of invasion if present: N/A Margins: Negative for carcinoma. Lymphatic or vascular invasion: Not identified. Lymph nodes: # examined - 0; # positive; N/A Extracapsular extension (if applicable): Not identified. TNM code: pT2, pNX  Non-neoplastic thyroid: No significant findings. (JBK:gt, 01/11/16)  For her postsurgical hypothyroidism, she is on Synthroid DAW 88 mcg, taken: - fasting - with water - separated by 2h from b'fast  - + calcium 2-4h ; + takes a 5000 unit vitamin D per week - no iron - no  PPIs - no multivitamins   I reviewed pt's thyroid tests: Lab Results  Component Value Date   TSH 2.810 10/27/2015   TSH 2.344 09/22/2014    Pt describes: - no fatigue - + weight loss - intentional: changed her diet >> reduced dairy - no cold intolerance - no constipation - no dry skin - no hair falling - + depression after the sx, no anxiety  She has no FH of thyroid disorders. No FH of thyroid cancer.  No h/o radiation tx to head or neck.  ROS: Constitutional: see HPI Eyes: no blurry vision, no xerophthalmia ENT: no sore throat, no nodules palpated in throat, no dysphagia/odynophagia, no hoarseness Cardiovascular: no CP/SOB/palpitations/leg swelling Respiratory: no cough/SOB Gastrointestinal: no N/V/D/C Musculoskeletal: no muscle/joint aches Skin: no rashes Neurological: no tremors/numbness/tingling/dizziness Psychiatric: + depression/no anxiety  Past Medical History  Diagnosis Date  . Cancer [facility])     papillary thyroid carcinoma    Past Surgical History  Procedure Laterality Date  . Thyroidectomy N/A 01/08/2016    Procedure: TOTAL THYROIDECTOMY;  Surgeon: Armandina Gemma, MD;  Location: [place];  Service: General;  Laterality: N/A;   Social History   Social History  . Marital Status: married    Spouse Name: N/A  . Number of Children: 3   Occupational History  . n/a   Social History Main Topics  . Smoking status: Never Smoker   . Smokeless tobacco: Not on file  . Alcohol Use: 0.0 oz/week    0 Standard drinks or equivalent per week     Comment: social - Tequila  . Drug Use: No  . Sexual Activity: Yes     Comment: IUD- MIRENA   Social History Narrative   Age of menarche: 35 yrs   Gravida: 4 pregnancies  3 children      Current Outpatient Prescriptions on File Prior to Visit  Medication Sig Dispense Refill  . calcium-vitamin D (OSCAL WITH D) 500-200 MG-UNIT tablet Take 2 tablets by mouth 3 (three) times daily. 60 tablet 1  . SYNTHROID 88  MCG tablet Take 1 tablet (88 mcg total) by mouth daily before breakfast. 30 tablet 3   No current facility-administered medications on file prior to visit.   No Known Allergies Family History  Problem Relation Age of Onset  . Hypertension Mother    PE: BP 112/60 mmHg  Pulse 70  Temp(Src) 98 F (36.7 C) (Oral)  Resp 12  Ht 5' (1.524 m)  Wt 102 lb 9.6 oz (46.539 kg)  BMI 20.04 kg/m2  SpO2 97% Wt Readings from Last 3 Encounters:  03/07/16 102 lb 9.6 oz (46.539 kg)  01/08/16 110 lb 14.3 oz (50.301 kg)  01/01/16 110 lb 14.3 oz (50.3 kg)   Constitutional: normal weight, in NAD Eyes: PERRLA, EOMI, no exophthalmos ENT: moist mucous membranes, no cervical masses; thyroidectomy scar healing well, no swelling or erythema, no cheloid; no cervical lymphadenopathy Cardiovascular: RRR, No MRG Respiratory: CTA B Gastrointestinal: abdomen soft, NT, ND, BS+ Musculoskeletal: no deformities, strength intact in all 4 Skin: moist, warm, no rashes Neurological: no tremor with outstretched hands, DTR normal in all 4  ASSESSMENT: 1. Thyroid cancer - see HPI  2. Postsurgical Hypothyroidism  PLAN:  1. Thyroid cancer - papillary - I had a long discussion with the patient about her recent diagnosis of thyroid cancer. We reviewed together the pathology >> she is stage 2 TNM  - I reassured her that papillary thyroid cancer is a slow growing cancer with good prognosis; her life expectancy is unlikely to be reduced due to the cancer.  - since the tumor was > 1.5 cm, radioactive iodine is indicated for post-op thyroid remnant ablation. The main role of this is to facilitate monitoring in the long run (by checking thyroglobulin). Pt agrees to have this, but prefers to wait until her children (76 and 51 y/o) are back in school), in 07/2016.   - I gave her materials about the radioactive iodine treatment and discussed about radiation safety at length - no need for low iodine diet before the treatment, but  given guidelines for the diet and advised her to that in the day of the tx - I explained the 2 modalities to raise TSH: hormone withdrawal and thyrogen stimulation >> we decided to use Thyrogen-stimulated RAI tx.  - we will checkthyroglobulin and Tg antibodies only after RAI tx - I will see the patient back in 4 mo and will order RAI then  2. Patient with h/o total thyroidectomy for [facility], now with iatrogenic hypothyroidism, on levothyroxine therapy. She appears euthyroid. She does not appear to have neck masses, enlarged cervical lymph nodes, or neck compression symptoms. Her cervical scar is healing well. - We discussed about correct intake of levothyroxine, fasting, with water, separated by at least 30 minutes from breakfast, and separated by more than 4 hours from calcium, iron, multivitamins, acid reflux medications (PPIs). I advised her to move Calcium 4h after Synthroid. - will check thyroid tests today: TSH, free T4 - target TSH: LLN or slightly lower for the first year after Sx - If these are abnormal, she will need to return in 6 weeks for repeat labs - If these are normal, we will recheck them at next visit  3. Vit D screening - she is  wandering whether is taking enough vit D >> will check level  Appointment on 03/07/2016  Component Date Value Ref Range Status  . Free T4 03/07/2016 1.11  0.60 - 1.60 ng/dL Final  . TSH 03/07/2016 0.35  0.35 - 4.50 uIU/mL Final  . VITD 03/07/2016 30.10  30.00 - 100.00 ng/mL Final    Tests are all normal, but vitamin D on the low side. She can continue current dose vitamin D and will recheck at next visit, since it should increase during the summer.

## 2016-03-08 ENCOUNTER — Encounter: Payer: Self-pay | Admitting: *Deleted

## 2016-03-16 ENCOUNTER — Telehealth: Payer: Self-pay | Admitting: Internal Medicine

## 2016-03-16 MED ORDER — SYNTHROID 88 MCG PO TABS: 88.0000 ug | ORAL_TABLET | Freq: Every day | ORAL | Status: DC

## 2016-03-16 NOTE — Telephone Encounter (Signed)
Patient need a refill of SYNTHROID 88 MCG tablet send  To  [facility] [id number] - [place], [place] - [address] [place] [address] [phone] (Phone) [phone] (Fax)

## 2016-03-16 NOTE — Telephone Encounter (Signed)
Refill sent.

## 2016-06-03 ENCOUNTER — Telehealth: Payer: Self-pay | Admitting: Internal Medicine

## 2016-06-03 NOTE — Telephone Encounter (Signed)
PT has questions regarding the radioactive treatment

## 2016-06-06 ENCOUNTER — Telehealth: Payer: Self-pay

## 2016-06-06 NOTE — Telephone Encounter (Signed)
Returning patient regarding the question she had for radioactive treatment. Patient stated she had an appointment tomorrow and would ask her then.

## 2016-06-07 ENCOUNTER — Encounter: Payer: Self-pay | Admitting: Internal Medicine

## 2016-06-07 ENCOUNTER — Ambulatory Visit (INDEPENDENT_AMBULATORY_CARE_PROVIDER_SITE_OTHER): Payer: BLUE CROSS/BLUE SHIELD | Admitting: Internal Medicine

## 2016-06-07 VITALS — BP 98/62 | HR 65 | Ht 62.0 in | Wt 102.0 lb

## 2016-06-07 DIAGNOSIS — E890 Postprocedural hypothyroidism: Secondary | ICD-10-CM

## 2016-06-07 DIAGNOSIS — C73 Malignant neoplasm of thyroid gland: Secondary | ICD-10-CM | POA: Diagnosis not present

## 2016-06-07 NOTE — Progress Notes (Signed)
Molly Prince, female   DOB: 08-24-1971, 45 y.o.   MRN: [id number]   HPI  Molly Prince is a 45 y.o.-year-old female, initially referred by Dr. Harlow Asa, for management of thyroid cancer and postsurgical hypothyroidism. Last visit 3 mo ago.  Pt. has been dx with thyroid cancer in 01/2016:  10/27/2015: Thyroid ultrasound:  Right thyroid lobe Measurements: 3.9 x 1.0 x 1.1 cm. No nodules visualized.  Left thyroid lobe Measurements: 3.5 x 1.8 x 1.8 cm. 2.3 x 1.8 x 1.8 cm heterogeneous solid mass within the left thyroid lobe. Smaller adjacent 8 mm solid nodule.  Isthmus Thickness: 2 mm in thickness. No nodules visualized. 11/26/2015: FNA: PTC (Bethesda category VI) 01/08/2016: Total thyroidectomy-Final pathology: Diagnosis Thyroid, thyroidectomy - PAPILLARY THYROID CARCINOMA, 2.4 CM. - CARCINOMA IS CONFINED TO THE THYROID. - THE SURGICAL RESECTION MARGINS ARE NEGATIVE FOR CARCINOMA. - SEE ONCOLOGY TABLE BELOW. Microscopic Comment THYROID Specimen: Thyroid. Procedure (including lymph node sampling if applicable): Total thyroidectomy. Specimen Integrity (intact/fragmented): Intact. Tumor focality: Unifocal Dominant tumor: Maximum tumor size (cm): 2.4 cm (gross measurement). Tumor laterality: Left lobe. Histologic type (including subtype and/or unique features as applicable): Papillary thyroid carcinoma. Tumor capsule: Not present. Extrathyroidal extension: Not identified. Capsular invasion with degree of invasion if present: N/A Margins: Negative for carcinoma. Lymphatic or vascular invasion: Not identified. Lymph nodes: # examined - 0; # positive; N/A Extracapsular extension (if applicable): Not identified. TNM code: pT2, pNX  Non-neoplastic thyroid: No significant findings. (JBK:gt, 01/11/16)  For her postsurgical hypothyroidism, she is on Synthroid DAW 88 mcg, taken: - fasting - with water - separated by 2h from b'fast  - + calcium 4h later; + takes a 5000 unit  vitamin D per week - no iron - no PPIs - no multivitamins   I reviewed pt's thyroid tests: Lab Results  Component Value Date   TSH 0.35 03/07/2016   TSH 2.810 10/27/2015   TSH 2.344 09/22/2014   FREET4 1.11 03/07/2016    Pt describes: - no fatigue - no weight gain/loss  - no cold intolerance - no constipation - no dry skin - no hair loss  She has no FH of thyroid disorders. No FH of thyroid cancer.  No h/o radiation tx to head or neck.  ROS: Constitutional: see HPI Eyes: no blurry vision, no xerophthalmia ENT: no sore throat, no nodules palpated in throat, no dysphagia/odynophagia, no hoarseness Cardiovascular: no CP/SOB/palpitations/leg swelling Respiratory: no cough/SOB Gastrointestinal: no N/V/D/C Musculoskeletal: no muscle/joint aches Skin: no rashes Neurological: no tremors/numbness/tingling/dizziness  I reviewed pt's medications, allergies, PMH, social hx, family hx, and changes were documented in the history of present illness. Otherwise, unchanged from my initial visit note.  Past Medical History:  Diagnosis Date  . Cancer [facility]    papillary thyroid carcinoma    Past Surgical History:  Procedure Laterality Date  . THYROIDECTOMY N/A 01/08/2016   Procedure: TOTAL THYROIDECTOMY;  Surgeon: Armandina Gemma, MD;  Location: [place];  Service: General;  Laterality: N/A;   Social History   Social History  . Marital Status: married    Spouse Name: N/A  . Number of Children: 3   Occupational History  . n/a   Social History Main Topics  . Smoking status: Never Smoker   . Smokeless tobacco: Not on file  . Alcohol Use: 0.0 oz/week    0 Standard drinks or equivalent per week     Comment: social - Tequila  . Drug Use: No  . Sexual Activity: Yes  Comment: IUD- MIRENA   Social History Narrative   Age of menarche: 45 yrs   Gravida: 4 pregnancies                  3 children      45 y.o.  Medication Sig Dispense  Refill  . calcium-vitamin D (OSCAL WITH D) 500-200 MG-UNIT tablet Take 2 tablets by mouth 3 (three) times daily. 60 tablet 1  . SYNTHROID 88 MCG tablet Take 1 tablet (88 mcg total) by mouth daily before breakfast. 30 tablet 3   No current facility-administered medications on file prior to visit.    No Known Allergies Family History  Problem Relation Age of Onset  . Hypertension Mother    PE: BP 98/62 (BP Location: Left Arm, Molly Position: Sitting)   Pulse 65   Ht 5' 2"  (1.575 m)   Wt 102 lb (46.3 kg)   SpO2 96%   BMI 18.66 kg/m  Wt Readings from Last 3 Encounters:  06/07/16 102 lb (46.3 kg)  03/07/16 102 lb 9.6 oz (46.5 kg)  01/01/16 110 lb 14.3 oz (50.3 kg)   Constitutional: normal weight, in NAD Eyes: PERRLA, EOMI, no exophthalmos ENT: moist mucous membranes, no cervical masses; thyroidectomy scar healing well, no swelling or erythema, no cheloid; no cervical lymphadenopathy Cardiovascular: RRR, No MRG Respiratory: CTA B Gastrointestinal: abdomen soft, NT, ND, BS+ Musculoskeletal: no deformities, strength intact in all 4 Skin: moist, warm, no rashes Neurological: no tremor with outstretched hands, DTR normal in all 4  ASSESSMENT: 1. Thyroid cancer - see HPI  2. Postsurgical Hypothyroidism  PLAN:  1. Thyroid cancer - papillary - We reviewed together the pathology of her PTC >> she is stage 2 TNM  - since the tumor was > 1.5 cm, radioactive iodine is indicated for post-op thyroid remnant ablation. The main role of this is to facilitate monitoring in the long run (by checking thyroglobulin). Pt agreed to have this at last visit, but prefered to wait until her children (28 and 47 y/o) were back in school), in 07/2016 >> will need to schedule this for next month. - I again gave her materials about the radioactive iodine treatment and discussed about radiation safety at length at last visit and again at this visit - I explained the 2 modalities to raise TSH: hormone  withdrawal and thyrogen stimulation >> we decided to use Thyrogen-stimulated RAI tx.  - we will checkthyroglobulin and Tg antibodies only after RAI tx - I will see the Molly back in 2 mo after RAI tx (3 mo from now)  2. Molly with h/o total thyroidectomy for [facility], now with iatrogenic hypothyroidism, on levothyroxine therapy. She appears euthyroid. She does not appear to have neck masses, enlarged cervical lymph nodes, or neck compression symptoms. Her cervical scar is healing well. - We discussed about correct intake of levothyroxine, fasting, with water, separated by at least 30 minutes from breakfast, and separated by more than 4 hours from calcium, iron, multivitamins, acid reflux medications (PPIs).She is now taking it correctly. - will check thyroid tests at next visit as they were at goal 3 mo ago and she did not change the way she takes the LT4: TSH, free T4 - target TSH: LLN or slightly lower for the first year after Sx  Orders Placed This Encounter  Procedures  . NM Whole Body I131 Scan S/P Ca Rx  . NM RAI Thyroid Ca W/ Thyrogen   Philemon Kingdom,  MD PhD [facility] Endocrinology

## 2016-06-07 NOTE — Patient Instructions (Addendum)
Please come back in 3 months.  You will be called for the RAI treatment schedule.  Please continue Synthroid 88 mcg daily.  Take the thyroid hormone every day, with water, at least 30 minutes before breakfast, separated by at least 4 hours from: - acid reflux medications - calcium - iron - multivitamins

## 2016-06-15 ENCOUNTER — Ambulatory Visit (HOSPITAL_COMMUNITY): Payer: BLUE CROSS/BLUE SHIELD

## 2016-06-16 ENCOUNTER — Ambulatory Visit (HOSPITAL_COMMUNITY): Payer: BLUE CROSS/BLUE SHIELD

## 2016-06-17 ENCOUNTER — Encounter (HOSPITAL_COMMUNITY): Payer: BLUE CROSS/BLUE SHIELD

## 2016-06-24 ENCOUNTER — Inpatient Hospital Stay (HOSPITAL_COMMUNITY): Admission: RE | Admit: 2016-06-24 | Payer: BLUE CROSS/BLUE SHIELD | Source: Ambulatory Visit

## 2016-06-27 ENCOUNTER — Ambulatory Visit (HOSPITAL_COMMUNITY): Payer: BLUE CROSS/BLUE SHIELD

## 2016-06-27 ENCOUNTER — Encounter (HOSPITAL_COMMUNITY)
Admission: RE | Admit: 2016-06-27 | Discharge: 2016-06-27 | Disposition: A | Discharge status: Home / Self Care | Payer: BLUE CROSS/BLUE SHIELD | Source: Ambulatory Visit | Attending: Internal Medicine | Admitting: Internal Medicine

## 2016-06-27 DIAGNOSIS — C73 Malignant neoplasm of thyroid gland: Secondary | ICD-10-CM | POA: Diagnosis not present

## 2016-06-27 MED ORDER — THYROTROPIN ALFA 1.1 MG IM SOLR
0.9000 mg | INTRAMUSCULAR | Status: AC
  Administered 2016-06-27: 0.9 mg via INTRAMUSCULAR

## 2016-06-27 MED ORDER — THYROTROPIN ALFA 1.1 MG IM SOLR
INTRAMUSCULAR | Status: AC
  Filled 2016-06-27: qty 0.9

## 2016-06-28 ENCOUNTER — Ambulatory Visit (HOSPITAL_COMMUNITY): Payer: BLUE CROSS/BLUE SHIELD

## 2016-06-28 ENCOUNTER — Encounter (HOSPITAL_COMMUNITY)
Admission: RE | Admit: 2016-06-28 | Discharge: 2016-06-28 | Disposition: A | Discharge status: Home / Self Care | Payer: BLUE CROSS/BLUE SHIELD | Source: Ambulatory Visit | Attending: Internal Medicine | Admitting: Internal Medicine

## 2016-06-28 DIAGNOSIS — C73 Malignant neoplasm of thyroid gland: Secondary | ICD-10-CM | POA: Diagnosis not present

## 2016-06-28 MED ORDER — THYROTROPIN ALFA 1.1 MG IM SOLR
0.9000 mg | Freq: Once | INTRAMUSCULAR | Status: AC
  Administered 2016-06-28: 0.9 mg via INTRAMUSCULAR

## 2016-06-28 MED ORDER — THYROTROPIN ALFA 1.1 MG IM SOLR
INTRAMUSCULAR | Status: AC
  Filled 2016-06-28: qty 0.9

## 2016-06-29 ENCOUNTER — Encounter (HOSPITAL_COMMUNITY): Payer: BLUE CROSS/BLUE SHIELD

## 2016-06-29 ENCOUNTER — Encounter (HOSPITAL_COMMUNITY)
Admission: RE | Admit: 2016-06-29 | Discharge: 2016-06-29 | Disposition: A | Discharge status: Home / Self Care | Payer: BLUE CROSS/BLUE SHIELD | Source: Ambulatory Visit | Attending: Internal Medicine | Admitting: Internal Medicine

## 2016-06-29 DIAGNOSIS — C73 Malignant neoplasm of thyroid gland: Secondary | ICD-10-CM | POA: Diagnosis not present

## 2016-06-29 LAB — HCG, QUANTITATIVE, PREGNANCY: hCG, Beta Chain, Quant, S: <1 m[IU]/mL (ref <5)

## 2016-06-29 MED ORDER — SODIUM IODIDE I 131 CAPSULE
72.0000 | Freq: Once | INTRAVENOUS | Status: AC
  Administered 2016-06-29: 72 via ORAL

## 2016-07-08 ENCOUNTER — Encounter (HOSPITAL_COMMUNITY)
Admission: RE | Admit: 2016-07-08 | Discharge: 2016-07-08 | Disposition: A | Discharge status: Home / Self Care | Payer: BLUE CROSS/BLUE SHIELD | Source: Ambulatory Visit | Attending: Internal Medicine | Admitting: Internal Medicine

## 2016-07-08 ENCOUNTER — Encounter (HOSPITAL_COMMUNITY): Payer: BLUE CROSS/BLUE SHIELD

## 2016-07-08 DIAGNOSIS — C73 Malignant neoplasm of thyroid gland: Secondary | ICD-10-CM | POA: Diagnosis not present

## 2016-07-12 ENCOUNTER — Telehealth: Payer: Self-pay

## 2016-07-12 NOTE — Telephone Encounter (Signed)
Called patient and advised of body scan results. Patient had no questions or concerns at this time. Reminded patient of next appointment.

## 2016-09-07 ENCOUNTER — Encounter: Payer: Self-pay | Admitting: Internal Medicine

## 2016-09-07 ENCOUNTER — Ambulatory Visit (INDEPENDENT_AMBULATORY_CARE_PROVIDER_SITE_OTHER): Payer: BLUE CROSS/BLUE SHIELD | Admitting: Internal Medicine

## 2016-09-07 VITALS — BP 102/70 | HR 62 | Wt 107.0 lb

## 2016-09-07 DIAGNOSIS — C73 Malignant neoplasm of thyroid gland: Secondary | ICD-10-CM | POA: Diagnosis not present

## 2016-09-07 DIAGNOSIS — E890 Postprocedural hypothyroidism: Secondary | ICD-10-CM | POA: Diagnosis not present

## 2016-09-07 LAB — T4, FREE: Free T4: 0.85 ng/dL (ref 0.60–1.60)

## 2016-09-07 LAB — TSH: TSH: 0.59 u[IU]/mL (ref 0.35–4.50)

## 2016-09-07 LAB — CALCIUM: Calcium: 8.8 mg/dL (ref 8.4–10.5)

## 2016-09-07 NOTE — Progress Notes (Signed)
Patient ID: Molly Prince, female   DOB: 06-13-1971, 45 y.o.   MRN: [id number]   HPI  Molly Prince is a 45 y.o.-year-old female, initially referred by Dr. Harlow Asa, for management of thyroid cancer and postsurgical hypothyroidism. Last visit 3 mo ago.  Pt. has been dx with thyroid cancer in 01/2016:  10/27/2015: Thyroid ultrasound:  Right thyroid lobe Measurements: 3.9 x 1.0 x 1.1 cm. No nodules visualized.  Left thyroid lobe Measurements: 3.5 x 1.8 x 1.8 cm. 2.3 x 1.8 x 1.8 cm heterogeneous solid mass within the left thyroid lobe. Smaller adjacent 8 mm solid nodule.  Isthmus Thickness: 2 mm in thickness. No nodules visualized. 11/26/2015: FNA: PTC (Bethesda category VI) 01/08/2016: Total thyroidectomy-Final pathology: Diagnosis Thyroid, thyroidectomy - PAPILLARY THYROID CARCINOMA, 2.4 CM. - CARCINOMA IS CONFINED TO THE THYROID. - THE SURGICAL RESECTION MARGINS ARE NEGATIVE FOR CARCINOMA. - SEE ONCOLOGY TABLE BELOW. Microscopic Comment THYROID Specimen: Thyroid. Procedure (including lymph node sampling if applicable): Total thyroidectomy. Specimen Integrity (intact/fragmented): Intact. Tumor focality: Unifocal Dominant tumor: Maximum tumor size (cm): 2.4 cm (gross measurement). Tumor laterality: Left lobe. Histologic type (including subtype and/or unique features as applicable): Papillary thyroid carcinoma. Tumor capsule: Not present. Extrathyroidal extension: Not identified. Capsular invasion with degree of invasion if present: N/A Margins: Negative for carcinoma. Lymphatic or vascular invasion: Not identified. Lymph nodes: # examined - 0; # positive; N/A Extracapsular extension (if applicable): Not identified. TNM code: pT2, pNX Non-neoplastic thyroid: No significant findings. (JBK:gt, 01/11/16) 06/30/2016: RAI treatment 72 mCi 07/08/2016: Post treatment whole-body scan: negative for metastases  For her postsurgical hypothyroidism, she is on Synthroid DAW 88 mcg,  taken: - fasting - with water - separated by 2h from b'fast  - + calcium 500 - 400 units D - later in the day - no iron - no PPIs - no multivitamins   I reviewed pt's thyroid tests: Lab Results  Component Value Date   TSH 0.35 03/07/2016   TSH 2.810 10/27/2015   TSH 2.344 09/22/2014   FREET4 1.11 03/07/2016    Pt describes: - no fatigue - no weight gain/loss  - no cold intolerance - no constipation - no dry skin - no hair loss  She has no FH of thyroid disorders. No FH of thyroid cancer.  No h/o radiation tx to head or neck.  ROS: Constitutional: see HPI Eyes: no blurry vision, no xerophthalmia ENT: no sore throat, no nodules palpated in throat, no dysphagia/odynophagia, no hoarseness Cardiovascular: no CP/SOB/palpitations/leg swelling Respiratory: no cough/SOB Gastrointestinal: no N/V/D/C Musculoskeletal: no muscle/joint aches Skin: no rashes Neurological: no tremors/numbness/tingling/dizziness  I reviewed pt's medications, allergies, PMH, social hx, family hx, and changes were documented in the history of present illness. Otherwise, unchanged from my initial visit note.  Past Medical History:  Diagnosis Date  . Cancer [facility])    papillary thyroid carcinoma    Past Surgical History:  Procedure Laterality Date  . THYROIDECTOMY N/A 01/08/2016   Procedure: TOTAL THYROIDECTOMY;  Surgeon: Armandina Gemma, MD;  Location: [place];  Service: General;  Laterality: N/A;   Social History   Social History  . Marital Status: married    Spouse Name: N/A  . Number of Children: 3   Occupational History  . n/a   Social History Main Topics  . Smoking status: Never Smoker   . Smokeless tobacco: Not on file  . Alcohol Use: 0.0 oz/week    0 Standard drinks or equivalent per week     Comment: social - Tequila  .  Drug Use: No  . Sexual Activity: Yes     Comment: IUD- MIRENA   Social History Narrative   Age of menarche: 50 yrs   Gravida: 4 pregnancies                  3  children      Current Outpatient Prescriptions on File Prior to Visit  Medication Sig Dispense Refill  . calcium-vitamin D (OSCAL WITH D) 500-200 MG-UNIT tablet Take 2 tablets by mouth 3 (three) times daily. 60 tablet 1  . SYNTHROID 88 MCG tablet Take 1 tablet (88 mcg total) by mouth daily before breakfast. 30 tablet 3   No current facility-administered medications on file prior to visit.    No Known Allergies Family History  Problem Relation Age of Onset  . Hypertension Mother    PE: BP 102/70 (BP Location: Left Arm, Patient Position: Sitting)   Pulse 62   Wt 107 lb (48.5 kg)   SpO2 99%   BMI 19.57 kg/m  Wt Readings from Last 3 Encounters:  09/07/16 107 lb (48.5 kg)  06/07/16 102 lb (46.3 kg)  03/07/16 102 lb 9.6 oz (46.5 kg)   Constitutional: normal weight, in NAD Eyes: PERRLA, EOMI, no exophthalmos ENT: moist mucous membranes, no cervical masses; thyroidectomy scar healed; no cervical lymphadenopathy Cardiovascular: RRR, No MRG Respiratory: CTA B Gastrointestinal: abdomen soft, NT, ND, BS+ Musculoskeletal: no deformities, strength intact in all 4 Skin: moist, warm, no rashes Neurological: no tremor with outstretched hands, DTR normal in all 4  ASSESSMENT: 1. Thyroid cancer - see HPI  2. Postsurgical Hypothyroidism  PLAN:  1. Thyroid cancer - papillary - We reviewed together the pathology of her PTC >> she is stage 2 TNM  - since the tumor was > 1.5 cm, radioactive iodine was indicated for post-op thyroid remnant ablation. The main role of this is to facilitate monitoring in the long run (by checking thyroglobulin). She had RAI treatment with Thyrogen on 06/30/2016 (72 mCi). Posttreatment whole-body scan was negative for metastasis. - we will checkthyroglobulin and Tg antibodies today - she is still on calcium postop >> will check a level and stop the supplement if the level is normal - She does not appear to have neck masses, enlarged cervical lymph nodes, or neck  compression symptoms. Her cervical scar has healed well. - I will see the patient back in 6 months; will plan to check a neck U/S in 1 year  2. Patient with h/o total thyroidectomy for [facility], now with iatrogenic hypothyroidism, on levothyroxine 88 mcg daily. She appears euthyroid.  - We discussed about correct intake of levothyroxine, fasting, with water, separated by at least 30 minutes from breakfast, and separated by more than 4 hours from calcium, iron, multivitamins, acid reflux medications (PPIs).She is taking it correctly. - will check thyroid tests today: TSH, free T4  - target TSH: LLN or slightly lower for the first year after Sx  Office Visit on 09/07/2016  Component Date Value Ref Range Status  . TSH 09/07/2016 0.59  0.35 - 4.50 uIU/mL Final  . Free T4 09/07/2016 0.85  0.60 - 1.60 ng/dL Final   Comment: Specimens from patients who are undergoing biotin therapy and /or ingesting biotin supplements may contain high levels of biotin.  The higher biotin concentration in these specimens interferes with this Free T4 assay.  Specimens that contain high levels  of biotin may cause false high results for this Free T4 assay.  Please interpret results in  light of the total clinical presentation of the patient.    . Thyroglobulin 09/08/2016 0.2* 2.8 - 40.9 ng/mL Final   Comment: Thyroglobulin antibodies (TGAb) interfere with Thyroglobulin (TG) assays; therefore, Thyroglobulin antibody (TGAb) assay should always be performed in conjunction with a Thyroglobulin (TG) assay.   This test was performed using the Beckman Coulter chemiluminescent method.  Values obtained from different assay methods cannot be used interchangeably.  Thyroglobulin levels, regardless of value, should not be interpreted as absolute evidence of the presence or absence of disease.   . Thyroglobulin Ab 09/08/2016 <1  <2 IU/mL Final  . Calcium 09/07/2016 8.8  8.4 - 10.5 mg/dL Final   Normal TFTs. Thyroglobulin is very  low, which is great. Calcium is normal, so I advise her that she can stop her calcium supplement.  Philemon Kingdom, MD PhD [facility] Endocrinology

## 2016-09-07 NOTE — Patient Instructions (Signed)
Please stop at the lab.  Please continue current Levothyroxine dose.  Take the thyroid hormone every day, with water, at least 30 minutes before breakfast, separated by at least 4 hours from: - acid reflux medications - calcium - iron - multivitamins  Please come back for a follow-up appointment in 6 months.

## 2016-09-08 ENCOUNTER — Telehealth: Payer: Self-pay

## 2016-09-08 LAB — THYROGLOBULIN ANTIBODY: Thyroglobulin Ab: <1 IU/mL (ref <2)

## 2016-09-08 LAB — THYROGLOBULIN LEVEL: Thyroglobulin: 0.2 ng/mL — ABNORMAL LOW (ref 2.8–40.9)

## 2016-09-08 MED ORDER — SYNTHROID 88 MCG PO TABS: 88.0000 ug | ORAL_TABLET | Freq: Every day | ORAL | 3 refills | Status: DC

## 2016-09-08 NOTE — Telephone Encounter (Signed)
Called patient and gave lab results. Patient had no questions or concerns.  

## 2016-10-13 ENCOUNTER — Telehealth: Payer: Self-pay | Admitting: Internal Medicine

## 2016-10-13 NOTE — Telephone Encounter (Signed)
Spoke with patient & she had forgotten that she had received results Shared with her [facility] Free Note    Called patient and gave lab results. Patient had no questions or concerns.

## 2016-10-13 NOTE — Telephone Encounter (Signed)
Please look in the results section and read her my note. She was already called with the results the next day after the results are back and she acknowledged that.

## 2016-10-13 NOTE — Telephone Encounter (Signed)
Pt called to get results of her test results from November.

## 2016-10-24 ENCOUNTER — Encounter: Payer: 59 | Admitting: Gynecology

## 2016-11-14 ENCOUNTER — Other Ambulatory Visit: Payer: Self-pay | Admitting: Gynecology

## 2016-11-14 DIAGNOSIS — Z1231 Encounter for screening mammogram for malignant neoplasm of breast: Secondary | ICD-10-CM

## 2016-12-07 DIAGNOSIS — C73 Malignant neoplasm of thyroid gland: Secondary | ICD-10-CM | POA: Diagnosis not present

## 2016-12-13 ENCOUNTER — Ambulatory Visit: Payer: BLUE CROSS/BLUE SHIELD

## 2016-12-20 ENCOUNTER — Ambulatory Visit: Payer: BLUE CROSS/BLUE SHIELD

## 2016-12-22 ENCOUNTER — Ambulatory Visit
Admission: RE | Admit: 2016-12-22 | Discharge: 2016-12-22 | Disposition: A | Discharge status: Home / Self Care | Payer: BLUE CROSS/BLUE SHIELD | Source: Ambulatory Visit | Attending: Gynecology | Admitting: Gynecology

## 2016-12-22 DIAGNOSIS — Z1231 Encounter for screening mammogram for malignant neoplasm of breast: Secondary | ICD-10-CM | POA: Diagnosis not present

## 2017-01-25 ENCOUNTER — Other Ambulatory Visit: Payer: Self-pay

## 2017-01-25 ENCOUNTER — Telehealth: Payer: Self-pay | Admitting: Internal Medicine

## 2017-01-25 MED ORDER — SYNTHROID 88 MCG PO TABS: 88.0000 ug | ORAL_TABLET | Freq: Every day | ORAL | 3 refills | Status: DC

## 2017-01-25 NOTE — Telephone Encounter (Signed)
Submitted

## 2017-01-25 NOTE — Telephone Encounter (Signed)
Pt called in and requested a refill of her Synthroid be sent to the [facility] in [place].

## 2017-01-31 ENCOUNTER — Telehealth: Payer: Self-pay

## 2017-01-31 ENCOUNTER — Telehealth: Payer: Self-pay | Admitting: Internal Medicine

## 2017-01-31 NOTE — Telephone Encounter (Signed)
Called and advised to see PCP.

## 2017-01-31 NOTE — Telephone Encounter (Signed)
Please advise. Thank you

## 2017-01-31 NOTE — Telephone Encounter (Signed)
Called and advised patient to see a PCP. Patient understood.

## 2017-01-31 NOTE — Telephone Encounter (Signed)
Patient stated that she went to the dentist had her teeth checked out and everything was ok, she recently went to the dentist and had cavitates on just about every tooth, she would like a sooner appointment to see Cruzita Lederer about this problem.

## 2017-01-31 NOTE — Telephone Encounter (Signed)
Please advised her to schedule with PCP instead.

## 2017-02-02 ENCOUNTER — Encounter: Payer: Self-pay | Admitting: Gynecology

## 2017-02-02 ENCOUNTER — Ambulatory Visit (INDEPENDENT_AMBULATORY_CARE_PROVIDER_SITE_OTHER): Payer: BLUE CROSS/BLUE SHIELD | Admitting: Gynecology

## 2017-02-02 VITALS — BP 114/62 | Ht 62.0 in | Wt 104.0 lb

## 2017-02-02 DIAGNOSIS — Z87410 Personal history of cervical dysplasia: Secondary | ICD-10-CM | POA: Diagnosis not present

## 2017-02-02 DIAGNOSIS — Z01419 Encounter for gynecological examination (general) (routine) without abnormal findings: Secondary | ICD-10-CM

## 2017-02-02 LAB — CBC WITH DIFFERENTIAL/PLATELET
Basophils Absolute: 0 cells/uL (ref 0–200)
Basophils Relative: 0 %
Eosinophils Absolute: 74 cells/uL (ref 15–500)
Eosinophils Relative: 1 %
HCT: 38.2 % (ref 35.0–45.0)
Hemoglobin: 12.2 g/dL (ref 11.7–15.5)
Lymphocytes Relative: 20 %
Lymphs Abs: 1480 cells/uL (ref 850–3900)
MCH: 29.7 pg (ref 27.0–33.0)
MCHC: 31.9 g/dL — ABNORMAL LOW (ref 32.0–36.0)
MCV: 92.9 fL (ref 80.0–100.0)
MPV: 12.8 fL — ABNORMAL HIGH (ref 7.5–12.5)
Monocytes Absolute: 444 cells/uL (ref 200–950)
Monocytes Relative: 6 %
Neutro Abs: 5402 cells/uL (ref 1500–7800)
Neutrophils Relative %: 73 %
Platelets: 158 10*3/uL (ref 140–400)
RBC: 4.11 MIL/uL (ref 3.80–5.10)
RDW: 14.4 % (ref 11.0–15.0)
WBC: 7.4 10*3/uL (ref 3.8–10.8)

## 2017-02-02 LAB — COMPREHENSIVE METABOLIC PANEL
ALT: 14 U/L (ref 6–29)
AST: 15 U/L (ref 10–35)
Albumin: 3.7 g/dL (ref 3.6–5.1)
Alkaline Phosphatase: 49 U/L (ref 33–115)
BUN: 9 mg/dL (ref 7–25)
CO2: 25 mmol/L (ref 20–31)
Calcium: 8.4 mg/dL — ABNORMAL LOW (ref 8.6–10.2)
Chloride: 105 mmol/L (ref 98–110)
Creat: 0.63 mg/dL (ref 0.50–1.10)
Glucose, Bld: 77 mg/dL (ref 65–99)
Potassium: 3.7 mmol/L (ref 3.5–5.3)
Sodium: 140 mmol/L (ref 135–146)
Total Bilirubin: 0.5 mg/dL (ref 0.2–1.2)
Total Protein: 6.5 g/dL (ref 6.1–8.1)

## 2017-02-02 LAB — LIPID PANEL
Cholesterol: 128 mg/dL (ref <200)
HDL: 49 mg/dL — ABNORMAL LOW (ref >50)
LDL Cholesterol: 68 mg/dL (ref <100)
Total CHOL/HDL Ratio: 2.6 Ratio (ref <5.0)
Triglycerides: 57 mg/dL (ref <150)
VLDL: 11 mg/dL (ref <30)

## 2017-02-02 NOTE — Addendum Note (Signed)
Addended by: Nelva Nay on: 02/02/2017 12:11 PM   Modules accepted: Orders

## 2017-02-02 NOTE — Progress Notes (Signed)
Molly Prince [birth year] [id number]   History:    46 y.o.  for annual gyn exam with no complaints today. Review of patient's record indicated in December 2016 at time of her annual exam and incidental finding of a left thyroid nodule was noted and was referred to the general surgeon Dr. Catalina Antigua who had performed a total thyroidectomy and pathology reported demonstrated the following:  PAPILLARY THYROID CARCINOMA, 2.4 CM. - CARCINOMA IS CONFINED TO THE THYROID. - THE SURGICAL RESECTION MARGINS ARE NEGATIVE FOR CARCINOMA  She is a stage 2 TNM incidence equally received radioactive iodine treatment and for her postsurgical hypothyroidism she is currently on Synthroid 88 g daily and is being followed by the endocrinologist Dr. Renne Crigler. Patient is otherwise doing well.  Review of her record indicated that during her pregnancy back in 2007 she had abnormal Pap smear and cervical biopsy demonstrated CIN-1 and CIN-2 and subsequent Pap smears of been normal. Patient also had a Mirena IUD placed in 2016 and has done well. Her flu vaccine is up-to-date.  Past medical history,surgical history, family history and social history were all reviewed and documented in the EPIC chart.  Gynecologic History No LMP recorded. Patient is not currently having periods (Reason: IUD). Contraception: IUD Last Pap: 2015 in 2016. Results were: normal Last mammogram: 2018. Results were: normal  Obstetric History OB History  Gravida Para Term Preterm AB Living  5 3     2 3   SAB TAB Ectopic Multiple Live Births               # Outcome Date GA Lbr Len/2nd Weight Sex Delivery Anes PTL Lv  5 AB           4 AB           3 Para           2 Para           1 Para                ROS: A ROS was performed and pertinent positives and negatives are included in the history.  GENERAL: No fevers or chills. HEENT: No change in vision, no earache, sore throat or sinus congestion. NECK: No pain or stiffness. CARDIOVASCULAR:  No chest pain or pressure. No palpitations. PULMONARY: No shortness of breath, cough or wheeze. GASTROINTESTINAL: No abdominal pain, nausea, vomiting or diarrhea, melena or bright red blood per rectum. GENITOURINARY: No urinary frequency, urgency, hesitancy or dysuria. MUSCULOSKELETAL: No joint or muscle pain, no back pain, no recent trauma. DERMATOLOGIC: No rash, no itching, no lesions. ENDOCRINE: No polyuria, polydipsia, no heat or cold intolerance. No recent change in weight. HEMATOLOGICAL: No anemia or easy bruising or bleeding. NEUROLOGIC: No headache, seizures, numbness, tingling or weakness. PSYCHIATRIC: No depression, no loss of interest in normal activity or change in sleep pattern.     Exam: chaperone present  BP 114/62   Ht 5\' 2"  (1.575 m)   Wt 104 lb (47.2 kg)   BMI 19.02 kg/m   Body mass index is 19.02 kg/m.  General appearance : Well developed well nourished female. No acute distress HEENT: Eyes: no retinal hemorrhage or exudates,  Neck supple, trachea midline, no carotid bruits, no thyroidmegaly Lungs: Clear to auscultation, no rhonchi or wheezes, or rib retractions  Heart: Regular rate and rhythm, no murmurs or gallops Breast:Examined in sitting and supine position were symmetrical in appearance, no palpable masses or tenderness,  no skin retraction, no nipple  inversion, no nipple discharge, no skin discoloration, no axillary or supraclavicular lymphadenopathy Abdomen: no palpable masses or tenderness, no rebound or guarding Extremities: no edema or skin discoloration or tenderness  Pelvic:  Bartholin, Urethra, Skene Glands: Within normal limits             Vagina: No gross lesions or discharge  Cervix: No gross lesions or discharge, I string visualized  Uterus  anteverted, normal size, shape and consistency, non-tender and mobile  Adnexa  Without masses or tenderness  Anus and perineum  normal   Rectovaginal  normal sphincter tone without palpated masses or tenderness              Hemoccult not indicated     Assessment/Plan:  46 y.o. female for annual exam with past history of thyroid cancer doing well on Synthroid 88 g daily after postsurgical iodine radiation ablation. Patient with no menstrual cycle doing well with known IUD. Pap smear was done today. The following fasting blood work was ordered today: Comprehensive metabolic panel, fasting lipid profile, and CBC. We discussed importance of calcium vitamin D and weightbearing exercises for osteoporosis prevention.   Terrance Mass MD, 11:54 AM 02/02/2017

## 2017-02-07 LAB — PAP IG W/ RFLX HPV ASCU

## 2017-03-07 ENCOUNTER — Ambulatory Visit (INDEPENDENT_AMBULATORY_CARE_PROVIDER_SITE_OTHER): Payer: BLUE CROSS/BLUE SHIELD | Admitting: Internal Medicine

## 2017-03-07 ENCOUNTER — Encounter: Payer: Self-pay | Admitting: Internal Medicine

## 2017-03-07 VITALS — BP 102/62 | HR 69 | Wt 102.0 lb

## 2017-03-07 DIAGNOSIS — E890 Postprocedural hypothyroidism: Secondary | ICD-10-CM

## 2017-03-07 DIAGNOSIS — C73 Malignant neoplasm of thyroid gland: Secondary | ICD-10-CM

## 2017-03-07 LAB — TSH: TSH: 0.13 u[IU]/mL — ABNORMAL LOW (ref 0.35–4.50)

## 2017-03-07 LAB — T4, FREE: Free T4: 1.28 ng/dL (ref 0.60–1.60)

## 2017-03-07 MED ORDER — SYNTHROID 75 MCG PO TABS: 75.0000 ug | ORAL_TABLET | Freq: Every day | ORAL | 1 refills | Status: DC

## 2017-03-07 NOTE — Progress Notes (Addendum)
Patient ID: Molly Prince, female   DOB: October 01, 1971, 46 y.o.   MRN: [id number]   HPI  Molly Prince is a 46 y.o.-year-old female, initially referred by Dr. Harlow Asa, for management of thyroid cancer and postsurgical hypothyroidism. Last visit 6 mo ago..  Pt. has been dx with thyroid cancer in 01/2016:  Reviewed ThyCA hx: 10/27/2015: Thyroid ultrasound:  Right thyroid lobe Measurements: 3.9 x 1.0 x 1.1 cm. No nodules visualized.  Left thyroid lobe Measurements: 3.5 x 1.8 x 1.8 cm. 2.3 x 1.8 x 1.8 cm heterogeneous solid mass within the left thyroid lobe. Smaller adjacent 8 mm solid nodule.  Isthmus Thickness: 2 mm in thickness. No nodules visualized. 11/26/2015: FNA: PTC (Bethesda category VI) 01/08/2016: Total thyroidectomy-Final pathology: Diagnosis Thyroid, thyroidectomy - PAPILLARY THYROID CARCINOMA, 2.4 CM. - CARCINOMA IS CONFINED TO THE THYROID. - THE SURGICAL RESECTION MARGINS ARE NEGATIVE FOR CARCINOMA. - SEE ONCOLOGY TABLE BELOW. Microscopic Comment THYROID Specimen: Thyroid. Procedure (including lymph node sampling if applicable): Total thyroidectomy. Specimen Integrity (intact/fragmented): Intact. Tumor focality: Unifocal Dominant tumor: Maximum tumor size (cm): 2.4 cm (gross measurement). Tumor laterality: Left lobe. Histologic type (including subtype and/or unique features as applicable): Papillary thyroid carcinoma. Tumor capsule: Not present. Extrathyroidal extension: Not identified. Capsular invasion with degree of invasion if present: N/A Margins: Negative for carcinoma. Lymphatic or vascular invasion: Not identified. Lymph nodes: # examined - 0; # positive; N/A Extracapsular extension (if applicable): Not identified. TNM code: pT2, pNX Non-neoplastic thyroid: No significant findings. (JBK:gt, 01/11/16) 06/30/2016: RAI treatment 72 mCi 07/08/2016: Post treatment whole-body scan: negative for metastases  Component     Latest Ref Rng & Units 09/07/2016   Thyroglobulin     2.8 - 40.9 ng/mL 0.2 (L)  Thyroglobulin Ab     <2 IU/mL <1   For her postsurgical hypothyroidism, she is on Synthroid DAW 88 mcg taken: - in am - fasting - at least 30 min from b'fast - no Ca, Fe, MVI, PPIs - not on Biotin  I reviewed pt's thyroid tests: Lab Results  Component Value Date   TSH 0.59 09/07/2016   TSH 0.35 03/07/2016   TSH 2.810 10/27/2015   TSH 2.344 09/22/2014   FREET4 0.85 09/07/2016   FREET4 1.11 03/07/2016    She has no FH of thyroid disorders. No FH of thyroid cancer.  No h/o radiation tx to head or neck.  ROS: Constitutional: + slight weight loss, no fatigue, no subjective hyperthermia, no subjective hypothermia Eyes: no blurry vision, no xerophthalmia ENT: no sore throat, no nodules palpated in throat, no dysphagia, no odynophagia, no hoarseness Cardiovascular: no CP/no SOB/no palpitations/no leg swelling Respiratory: no cough/no SOB/no wheezing Gastrointestinal: no N/no V/no D/no C/no acid reflux Musculoskeletal: no muscle aches/no joint aches Skin: no rashes, no hair loss Neurological: no tremors/no numbness/no tingling/no dizziness, + occas. HAs  I reviewed pt's medications, allergies, PMH, social hx, family hx, and changes were documented in the history of present illness. Otherwise, unchanged from my initial visit note.  Past Medical History:  Diagnosis Date  . Cancer [facility])    papillary thyroid carcinoma    Past Surgical History:  Procedure Laterality Date  . THYROIDECTOMY N/A 01/08/2016   Procedure: TOTAL THYROIDECTOMY;  Surgeon: Armandina Gemma, MD;  Location: [place];  Service: General;  Laterality: N/A;   Social History   Social History  . Marital Status: married    Spouse Name: N/A  . Number of Children: 3   Occupational History  . n/a   Social  History Main Topics  . Smoking status: Never Smoker   . Smokeless tobacco: Not on file  . Alcohol Use: 0.0 oz/week    0 Standard drinks or equivalent per week      Comment: social - Tequila  . Drug Use: No  . Sexual Activity: Yes     Comment: IUD- MIRENA   Social History Narrative   Age of menarche: 36 yrs   Gravida: 4 pregnancies                  3 children      Current Outpatient Prescriptions on File Prior to Visit  Medication Sig Dispense Refill  . SYNTHROID 88 MCG tablet Take 1 tablet (88 mcg total) by mouth daily before breakfast. 30 tablet 3   No current facility-administered medications on file prior to visit.    No Known Allergies Family History  Problem Relation Age of Onset  . Hypertension Mother    PE: BP 102/62 (BP Location: Left Arm, Patient Position: Sitting)   Pulse 69   Wt 102 lb (46.3 kg)   SpO2 98%   BMI 18.66 kg/m  Wt Readings from Last 3 Encounters:  03/07/17 102 lb (46.3 kg)  02/02/17 104 lb (47.2 kg)  09/07/16 107 lb (48.5 kg)   Constitutional: normal weight, in NAD Eyes: PERRLA, EOMI, no exophthalmos ENT: moist mucous membranes, no thyroid masses felt, cervical scar healed, no cervical lymphadenopathy Cardiovascular: RRR, No MRG Respiratory: CTA B Gastrointestinal: abdomen soft, NT, ND, BS+ Musculoskeletal: no deformities, strength intact in all 4 Skin: moist, warm, no rashes Neurological: no tremor with outstretched hands, DTR normal in all 4  ASSESSMENT: 1. Thyroid cancer - see HPI  2. Postsurgical Hypothyroidism  PLAN:  1. Thyroid cancer - papillary - thyroid cancer hx was reviewed with the pt. >> she is stage 2 TNM - since the tumor was > 1.5 cm, radioactive iodine was indicated for post-op thyroid remnant ablation. The main role of this is to facilitate monitoring in the long run (by checking thyroglobulin). She had RAI treatment with Thyrogen on 06/30/2016 (72 mCi). Posttreatment whole-body scan was negative for metastasis. - today we will check her Tg + ATA. Last ATA were undetectable and Tg was very low. I explained the purpose of the test and expected results - she was still on calcium  postop at last visit >> we stopped as Ca level was normal. Denies perioral numbness or acran cramping - She does not appear to have neck masses, enlarged cervical lymph nodes, or neck compression symptoms. Her cervical scar healed very well. - I will see the patient back in 6 months; will plan to check a neck U/S at next visit  2. Patient with h/o total thyroidectomy for [facility], now with iatrogenic hypothyroidism - labs were consistently normal on Synthroid 88 mcg daily. She feels good on this dose. - I advised her to take the thyroid hormone every day, with water, at least 30 minutes before breakfast, separated by at least 4 hours from: - acid reflux medications - calcium - iron - multivitamins She is taking it correctly - will check thyroid tests today: TSH and fT4 - we again discussed about target TSH: LLN or slightly lower for the first year after Sx >> we can now relax the targets to the normal range  Needs a refill. 3 mo.  Component     Latest Ref Rng & Units 03/07/2017  TSH     0.35 - 4.50 uIU/mL 0.13 (  L)  T4,Free(Direct)     0.60 - 1.60 ng/dL 1.28  Thyroglobulin     ng/mL 0.1 (L)  Thyroglobulin Ab     <2 IU/mL <1   TSH is low, so we will reduce the Synthroid to 75 g daily. She will need a repeat set of labs in 5 weeks. Thyroglobulin is very low, which is great!  Philemon Kingdom, MD PhD [facility]

## 2017-03-07 NOTE — Patient Instructions (Signed)
Please stop at the lab.  Please continue current Synthroid dose: 88 mcg daily.  Take the thyroid hormone every day, with water, at least 30 minutes before breakfast, separated by at least 4 hours from: - acid reflux medications - calcium - iron - multivitamins  Please come back for a follow-up appointment in 6 months.

## 2017-03-08 ENCOUNTER — Telehealth: Payer: Self-pay

## 2017-03-08 ENCOUNTER — Telehealth: Payer: Self-pay | Admitting: Internal Medicine

## 2017-03-08 LAB — THYROGLOBULIN LEVEL: Thyroglobulin: 0.1 ng/mL — ABNORMAL LOW

## 2017-03-08 LAB — THYROGLOBULIN ANTIBODY: Thyroglobulin Ab: <1 IU/mL (ref <2)

## 2017-03-08 NOTE — Telephone Encounter (Signed)
Called patient and gave lab results. Patient had no questions or concerns.  

## 2017-03-08 NOTE — Telephone Encounter (Signed)
Patient is call for the result of her labs

## 2017-03-09 ENCOUNTER — Telehealth: Payer: Self-pay

## 2017-03-09 NOTE — Telephone Encounter (Signed)
Called and spoke to patient regarding lab work. Patient understood, had no questions.

## 2017-03-22 ENCOUNTER — Encounter: Payer: Self-pay | Admitting: Gynecology

## 2017-04-12 ENCOUNTER — Other Ambulatory Visit (INDEPENDENT_AMBULATORY_CARE_PROVIDER_SITE_OTHER): Payer: BLUE CROSS/BLUE SHIELD

## 2017-04-12 ENCOUNTER — Telehealth: Payer: Self-pay

## 2017-04-12 DIAGNOSIS — E890 Postprocedural hypothyroidism: Secondary | ICD-10-CM | POA: Diagnosis not present

## 2017-04-12 LAB — TSH: TSH: 1.44 u[IU]/mL (ref 0.35–4.50)

## 2017-04-12 LAB — T4, FREE: Free T4: 1.2 ng/dL (ref 0.60–1.60)

## 2017-04-12 NOTE — Telephone Encounter (Signed)
Called patient and gave lab results. Patient had no questions or concerns.  

## 2017-06-26 ENCOUNTER — Other Ambulatory Visit: Payer: Self-pay | Admitting: Internal Medicine

## 2017-09-05 ENCOUNTER — Ambulatory Visit (INDEPENDENT_AMBULATORY_CARE_PROVIDER_SITE_OTHER): Payer: BLUE CROSS/BLUE SHIELD | Admitting: Internal Medicine

## 2017-09-05 ENCOUNTER — Encounter: Payer: Self-pay | Admitting: Internal Medicine

## 2017-09-05 VITALS — BP 110/62 | HR 78 | Wt 109.0 lb

## 2017-09-05 DIAGNOSIS — E890 Postprocedural hypothyroidism: Secondary | ICD-10-CM

## 2017-09-05 DIAGNOSIS — C73 Malignant neoplasm of thyroid gland: Secondary | ICD-10-CM | POA: Diagnosis not present

## 2017-09-05 NOTE — Progress Notes (Addendum)
Patient ID: Molly Prince, female   DOB: 04-Dec-1970, 46 y.o.   MRN: [id number]   HPI  Molly Prince is a 46 y.o.-year-old female, initially referred by Dr. Harlow Asa, for management of thyroid cancer and postsurgical hypothyroidism. Last visit 6 mo ago.  Pt. has been dx with thyroid cancer in 01/2016:  Reviewed ThyCA hx: 10/27/2015: Thyroid ultrasound:  Right thyroid lobe Measurements: 3.9 x 1.0 x 1.1 cm. No nodules visualized.  Left thyroid lobe Measurements: 3.5 x 1.8 x 1.8 cm. 2.3 x 1.8 x 1.8 cm heterogeneous solid mass within the left thyroid lobe. Smaller adjacent 8 mm solid nodule.  Isthmus Thickness: 2 mm in thickness. No nodules visualized. 11/26/2015: FNA: PTC ([facility]hes[employer] category VI) 01/08/2016: Total thyroidectomy-Final pathology: Diagnosis Thyroid, thyroidectomy - PAPILLARY THYROID CARCINOMA, 2.4 CM. - CARCINOMA IS CONFINED TO THE THYROID. - THE SURGICAL RESECTION MARGINS ARE NEGATIVE FOR CARCINOMA. - SEE ONCOLOGY TABLE BELOW. Microscopic Comment THYROID Specimen: Thyroid. Procedure (including lymph node sampling if applicable): Total thyroidectomy. Specimen Integrity (intact/fragmented): Intact. Tumor focality: Unifocal Dominant tumor: Maximum tumor size (cm): 2.4 cm (gross measurement). Tumor laterality: Left lobe. Histologic type (including subtype and/or unique features as applicable): Papillary thyroid carcinoma. Tumor capsule: Not present. Extrathyroidal extension: Not identified. Capsular invasion with degree of invasion if present: N/A Margins: Negative for carcinoma. Lymphatic or vascular invasion: Not identified. Lymph nodes: # examined - 0; # positive; N/A Extracapsular extension (if applicable): Not identified. TNM code: pT2, pNX Non-neoplastic thyroid: No significant findings. (JBK:gt, 01/11/16) 06/30/2016: RAI treatment 72 mCi 07/08/2016: Post treatment whole-body scan: negative for metastases  Component     Latest Ref Rng & Units 09/07/2016 03/07/2017   Thyroglobulin     ng/mL 0.2 (L) 0.1 (L)  Thyroglobulin Ab     <2 IU/mL <1 <1   For her postsurgical hypothyroidism, she is on Synthroid DAW 88 >> 75 mcg taken: - in am - fasting - at least 30 min from b'fast - no Fe, MVI, PPIs - + calcium at bedtime - not on Biotin  I reviewed pt's thyroid tests: Lab Results  Component Value Date   TSH 1.44 04/12/2017   TSH 0.13 (L) 03/07/2017   TSH 0.59 09/07/2016   TSH 0.35 03/07/2016   TSH 2.810 10/27/2015   TSH 2.344 09/22/2014   FREET4 1.20 04/12/2017   FREET4 1.28 03/07/2017   FREET4 0.85 09/07/2016   FREET4 1.11 03/07/2016    She has no FH of thyroid disorders.No FH of thyroid cancer.   No seaweed or kelp. No recent contrast studies. No herbal supplements. No Biotin use. No recent steroids use.   She was on calcium after the Sx >> then Ca normalized >> we stopped. However, her calcium was slightly low at the appt with ObGyn >> told to restart Ca >> now 600 mg daily.  Lab Results  Component Value Date   CALCIUM 8.4 (L) 02/02/2017   CALCIUM 8.8 09/07/2016   CALCIUM 7.8 (L) 01/09/2016   CALCIUM 8.8 (L) 01/01/2016   CALCIUM 8.7 10/27/2015   CALCIUM 8.9 09/22/2014   Lab Results  Component Value Date   VD25OH 30.10 03/07/2016   ROS: Constitutional: no weight gain/no weight loss, no fatigue, no subjective hyperthermia, no subjective hypothermia Eyes: no blurry vision, no xerophthalmia ENT: no sore throat, no nodules palpated in throat, no dysphagia, no odynophagia, no hoarseness Cardiovascular: no CP/no SOB/no palpitations/no leg swelling Respiratory: no cough/no SOB/no wheezing Gastrointestinal: no N/no V/no D/no C/no acid reflux Musculoskeletal: no muscle aches/no joint aches  Skin: no rashes, no hair loss Neurological: no tremors/no numbness/no tingling/no dizziness  I reviewed pt's medications, allergies, PMH, social hx, family hx, and changes were documented in the history of present illness. Otherwise, unchanged from  my initial visit note.  Past Medical History:  Diagnosis Date  . Cancer [facility])    papillary thyroid carcinoma    Past Surgical History:  Procedure Laterality Date  . THYROIDECTOMY N/A 01/08/2016   Procedure: TOTAL THYROIDECTOMY;  Surgeon: Armandina Gemma, MD;  Location: [place];  Service: General;  Laterality: N/A;   Social History   Social History  . Marital Status: married    Spouse Name: N/A  . Number of Children: 3   Occupational History  . n/a   Social History Main Topics  . Smoking status: Never Smoker   . Smokeless tobacco: Not on file  . Alcohol Use: 0.0 oz/week    0 Standard drinks or equivalent per week     Comment: social - Tequila  . Drug Use: No  . Sexual Activity: Yes     Comment: IUD- MIRENA   Social History Narrative   Age of menarche: 18 yrs   Gravida: 4 pregnancies                  3 children      Current Outpatient Prescriptions on File Prior to Visit  Medication Sig Dispense Refill  . SYNTHROID 75 MCG tablet TAKE 1 TABLET(75 MCG) BY MOUTH DAILY BEFORE BREAKFAST 45 tablet 0   No current facility-administered medications on file prior to visit.    No Known Allergies Family History  Problem Relation Age of Onset  . Hypertension Mother    PE: BP 110/62 (BP Location: Left Arm, Patient Position: Sitting)   Pulse 78   Wt 109 lb (49.4 kg)   SpO2 98%   BMI 19.94 kg/m  Wt Readings from Last 3 Encounters:  09/05/17 109 lb (49.4 kg)  03/07/17 102 lb (46.3 kg)  02/02/17 104 lb (47.2 kg)   Constitutional: normal weight, in NAD Eyes: PERRLA, EOMI, no exophthalmos ENT: moist mucous membranes, no thyroid masses palpated, thyroid scar healed, no cervical lymphadenopathy Cardiovascular: RRR, No MRG Respiratory: CTA B Gastrointestinal: abdomen soft, NT, ND, BS+ Musculoskeletal: no deformities, strength intact in all 4 Skin: moist, warm, no rashes Neurological: no tremor with outstretched hands, DTR normal in all 4  ASSESSMENT: 1. Thyroid cancer - see  HPI  2. Postsurgical Hypothyroidism  3. Hypocalcemia  PLAN:  1. Thyroid cancer - papillary - thyroid cancer hx was reviewed with the pt. >> she is stage 2 TNM - since the tumor was > 1.5 cm, radioactive iodine was indicated for post-op thyroid remnant ablation. The main role of this is to facilitate monitoring in the long run (by checking thyroglobulin). She had RAI treatment with Thyrogen on 06/30/2016 (72 mCi). Posttreatment whole-body scan was negative for metastasis. - We again discussed about the very good prognosis of her cancer, since she is still anxious about the diagnosis. I reassured her that her cancer was low risk and the fact that we proceed with thyroidectomy and also RAI treatment makes recurrence is very unlikely - At last visit, her thyroglobulin was detectable so we will recheck it today - She does not appear to have neck masses, large cervical lymph node, or neck compression symptoms. Her cervical scar is completely healed - will check a neck U/S now, one year after her RAI treatment  2. Patient with h/o total thyroidectomy  for [facility], now with iatrogenic hypothyroidism - latest thyroid labs reviewed with pt >> normal  - she continues on LT4 mcg 75 daily (dose decreased at last visit) - pt feels good on this dose. - we discussed about taking the thyroid hormone every day, with water, >30 minutes before breakfast, separated by >4 hours from acid reflux medications, calcium, iron, multivitamins. Pt. is taking it correctly - will check thyroid tests today: TSH and fT4 - If labs are abnormal, she will need to return for repeat TFTs in 1.5 months - OTW, RTC in 1 year.  3. Hypocalcemia - Patient had the lower calcium level earlier this year but per review of the chart she has had some low or borderline levels even before the thyroid surgery. I suspect vitamin D deficiency. Reviewing the chart, she did have a borderline low vitamin D level in 2017. At this visit, I will recheck  her vitamin D and her calcium. Of note, she started back on calcium 600 mg at bedtime this Spring. - She denies perioral numbness or acral cramping  Needs refills.  Component     Latest Ref Rng & Units 09/05/2017  Thyroglobulin     ng/mL 0.1 (L)  TSH     0.35 - 4.50 uIU/mL 1.69  T4,Free(Direct)     0.60 - 1.60 ng/dL 0.87  Thyroglobulin Ab     < or = 1 IU/mL <1   Office Visit on 09/05/2017  . Glucose, Bld 09/05/2017 80  65 - 99 mg/dL Final   Comment: .            Fasting reference interval .   . BUN 09/05/2017 9  7 - 25 mg/dL Final  . Creat 09/05/2017 0.61  0.50 - 1.10 mg/dL Final  . GFR, Est Non African American 09/05/2017 108  > OR = 60 mL/min/1.61m Final  . GFR, Est African American 09/05/2017 125  > OR = 60 mL/min/1.778mFinal  . BUN/Creatinine Ratio 1099/37/1696OT APPLICABLE  6 - 22 (calc) Final  . Sodium 09/05/2017 139  135 - 146 mmol/L Final  . Potassium 09/05/2017 4.3  3.5 - 5.3 mmol/L Final  . Chloride 09/05/2017 103  98 - 110 mmol/L Final  . CO2 09/05/2017 26  20 - 32 mmol/L Final  . Calcium 09/05/2017 9.0  8.6 - 10.2 mg/dL Final   Labs normal Tg at [facility].  [place]:  History thyroid carcinoma, status post thyroidectomy and radio iodine treatment 2017  EXAM: THYROID ULTRASOUND  TECHNIQUE: Ultrasound examination of the thyroid gland and adjacent soft tissues was performed.  COMPARISON:  11/26/2015  FINDINGS: Parenchymal Echotexture: Interval thyroidectomy. No residual or recurrent tissue evident.  Isthmus: Surgically absent  Right lobe: Surgically absent  Left lobe: Surgically absent  _________________________________________________________  Estimated total number of nodules >/= 1 cm: 0  Number of spongiform nodules >/=  2 cm not described below (TR1): 0  Number of mixed cystic and solid nodules >/= 1.5 cm not described below (TR2): 0  _________________________________________________________  No discrete nodules are seen  within the thyroidectomy bed. No cervical adenopathy on limited interrogation.  IMPRESSION: No residual/ recurrent tissue post thyroidectomy.  The above is in keeping with the ACR TI-RADS recommendations - J Am Coll Radiol 2017;14:587-595.  Electronically Signed   By: D Lucrezia Europe.D.   On: 09/12/2017 10:28  Neck U/S shows no recurrence or metastasis.  CrPhilemon KingdomMD PhD [facility]

## 2017-09-05 NOTE — Patient Instructions (Addendum)
Please stop at the lab.  Please continue current Synthroid dose: 75 mcg daily.  Take the thyroid hormone every day, with water, at least 30 minutes before breakfast, separated by at least 4 hours from: - acid reflux medications - calcium - iron - multivitamins  We will schedule a neck U/S for you.  Please come back for a follow-up appointment in 1 year.

## 2017-09-06 LAB — BASIC METABOLIC PANEL WITH GFR
BUN: 9 mg/dL (ref 7–25)
CO2: 26 mmol/L (ref 20–32)
Calcium: 9 mg/dL (ref 8.6–10.2)
Chloride: 103 mmol/L (ref 98–110)
Creat: 0.61 mg/dL (ref 0.50–1.10)
GFR, Est African American: 125 mL/min/{1.73_m2} (ref >=60)
GFR, Est Non African American: 108 mL/min/{1.73_m2} (ref >=60)
Glucose, Bld: 80 mg/dL (ref 65–99)
Potassium: 4.3 mmol/L (ref 3.5–5.3)
Sodium: 139 mmol/L (ref 135–146)

## 2017-09-06 LAB — THYROGLOBULIN LEVEL: Thyroglobulin: 0.1 ng/mL — ABNORMAL LOW

## 2017-09-06 LAB — THYROGLOBULIN ANTIBODY: Thyroglobulin Ab: <1 IU/mL (ref <=1)

## 2017-09-06 LAB — VITAMIN D 25 HYDROXY (VIT D DEFICIENCY, FRACTURES): VITD: 30.43 ng/mL (ref 30.00–100.00)

## 2017-09-06 LAB — T4, FREE: Free T4: 0.87 ng/dL (ref 0.60–1.60)

## 2017-09-06 LAB — TSH: TSH: 1.69 u[IU]/mL (ref 0.35–4.50)

## 2017-09-07 ENCOUNTER — Telehealth: Payer: Self-pay

## 2017-09-07 ENCOUNTER — Ambulatory Visit: Payer: BLUE CROSS/BLUE SHIELD | Admitting: Internal Medicine

## 2017-09-07 NOTE — Telephone Encounter (Signed)
-----   Message from Philemon Kingdom, MD sent at 09/07/2017  7:57 AM EDT ----- Almyra Free, can you please call pt: Labs normal. Thyroglobulin is stable, at lower limit of normal. Can you please refill her LT4? 90 days supply with 3 refills.

## 2017-09-07 NOTE — Telephone Encounter (Signed)
Called patient and gave lab results. Patient had no questions or concerns.  

## 2017-09-11 ENCOUNTER — Ambulatory Visit
Admission: RE | Admit: 2017-09-11 | Discharge: 2017-09-11 | Disposition: A | Discharge status: Home / Self Care | Payer: BLUE CROSS/BLUE SHIELD | Source: Ambulatory Visit | Attending: Internal Medicine | Admitting: Internal Medicine

## 2017-09-11 DIAGNOSIS — C73 Malignant neoplasm of thyroid gland: Secondary | ICD-10-CM

## 2017-09-13 ENCOUNTER — Telehealth: Payer: Self-pay

## 2017-09-13 NOTE — Telephone Encounter (Signed)
-----   Message from Philemon Kingdom, MD sent at 09/12/2017 11:35 AM EST ----- Almyra Free, can you please call pt: Neck U/S shows no recurrence or metastasis from her previous Thyroid cancer. Good news!

## 2017-09-13 NOTE — Telephone Encounter (Signed)
LVM, gave lab results. Gave call back number if any questions or concerns.  

## 2017-09-13 NOTE — Telephone Encounter (Signed)
Called and advised patient to stop Calcium. No other questions.

## 2017-10-10 ENCOUNTER — Other Ambulatory Visit: Payer: Self-pay | Admitting: Internal Medicine

## 2017-10-11 ENCOUNTER — Telehealth: Payer: Self-pay | Admitting: Internal Medicine

## 2017-10-11 NOTE — Telephone Encounter (Signed)
Patient needs prescription for Synthroid sent to [facility] in [place]

## 2017-10-11 NOTE — Telephone Encounter (Signed)
That was filled this morning

## 2017-12-21 DIAGNOSIS — M25561 Pain in right knee: Secondary | ICD-10-CM | POA: Diagnosis not present

## 2018-01-10 ENCOUNTER — Other Ambulatory Visit: Payer: Self-pay

## 2018-01-10 ENCOUNTER — Telehealth: Payer: Self-pay | Admitting: Internal Medicine

## 2018-01-10 MED ORDER — SYNTHROID 75 MCG PO TABS: ORAL_TABLET | ORAL | 1 refills | Status: DC

## 2018-01-10 NOTE — Telephone Encounter (Signed)
Patient need refill for   Original Order:  SYNTHROID 75 MCG tablet [[id number]]    Pharmacy:  [facility] [place], [place] - [address] OF [address] DEA #:  [id number]     Patient is out of medication.

## 2018-01-10 NOTE — Telephone Encounter (Signed)
This has been done.

## 2018-01-19 DIAGNOSIS — M79601 Pain in right arm: Secondary | ICD-10-CM | POA: Diagnosis not present

## 2018-03-01 DIAGNOSIS — L988 Other specified disorders of the skin and subcutaneous tissue: Secondary | ICD-10-CM | POA: Diagnosis not present

## 2018-03-01 DIAGNOSIS — L814 Other melanin hyperpigmentation: Secondary | ICD-10-CM | POA: Diagnosis not present

## 2018-06-19 DIAGNOSIS — M62830 Muscle spasm of back: Secondary | ICD-10-CM | POA: Diagnosis not present

## 2018-06-19 DIAGNOSIS — M9903 Segmental and somatic dysfunction of lumbar region: Secondary | ICD-10-CM | POA: Diagnosis not present

## 2018-06-22 ENCOUNTER — Ambulatory Visit (INDEPENDENT_AMBULATORY_CARE_PROVIDER_SITE_OTHER): Payer: BLUE CROSS/BLUE SHIELD | Admitting: Obstetrics & Gynecology

## 2018-06-22 ENCOUNTER — Encounter: Payer: Self-pay | Admitting: Obstetrics & Gynecology

## 2018-06-22 VITALS — Ht 59.5 in | Wt 107.0 lb

## 2018-06-22 DIAGNOSIS — Z01419 Encounter for gynecological examination (general) (routine) without abnormal findings: Secondary | ICD-10-CM

## 2018-06-22 DIAGNOSIS — Z30431 Encounter for routine checking of intrauterine contraceptive device: Secondary | ICD-10-CM

## 2018-06-22 DIAGNOSIS — T8332XA Displacement of intrauterine contraceptive device, initial encounter: Secondary | ICD-10-CM

## 2018-06-22 DIAGNOSIS — N760 Acute vaginitis: Secondary | ICD-10-CM

## 2018-06-22 DIAGNOSIS — B9689 Other specified bacterial agents as the cause of diseases classified elsewhere: Secondary | ICD-10-CM

## 2018-06-22 DIAGNOSIS — C73 Malignant neoplasm of thyroid gland: Secondary | ICD-10-CM

## 2018-06-22 MED ORDER — TINIDAZOLE 500 MG PO TABS: 2.0000 g | ORAL_TABLET | Freq: Every day | ORAL | 0 refills | Status: AC

## 2018-06-22 NOTE — Patient Instructions (Signed)
1. Well female exam with routine gynecological exam Normal gynecologic exam.  Recent Pap test July 2019 normal per patient, done in [place].  Breast exam normal.  Will schedule screening mammogram now, recommend 3D.  Fasting health labs here today.  TSH followed by Dr. Cruzita Lederer the endocrinologist. - CBC - Comp Met (CMET) - Lipid panel - VITAMIN D 25 Hydroxy (Vit-D Deficiency, Fractures)  2. Encounter for routine checking of intrauterine contraceptive device (IUD) Mirena IUD since November 2016.  Strings not seen.  Patient will follow-up to confirm normal intrauterine location of the IUD.  3. Intrauterine contraceptive device threads lost, initial encounter Follow-up with a pelvic ultrasound to locate the IUD. - US Transvaginal Non-OB; Future  4. Bacterial vaginosis Frequent episodes of odor with vaginal discharge.  Not currently symptomatic.  One treatment of tinidazole sent to pharmacy to use as needed.  5. Papillary thyroid carcinoma (HCC) Status post thyroidectomy.  Followed by endocrinology Dr. Cruzita Lederer  Other orders - Calcium Carbonate-Vit D-Min (CALCIUM 1200 PO); Take by mouth. - tinidazole (TINDAMAX) 500 MG tablet; Take 4 tablets (2,000 mg total) by mouth daily for 2 days.  [place], fue un placer encontrarle hoy!  Voy a informarle de sus [employer].

## 2018-06-22 NOTE — Progress Notes (Signed)
Molly Prince 10/28/1971 [id number]   History:    47 y.o. G5P3A2L3  3 daughters: 28 yo (lives in [place]), 48 yo and 48 yo.  RP:  Established patient presenting for annual gyn exam   HPI: Well on Mirena IUD x 09/2015.  No menses/no BTB on it.  No pelvic pain.  Normal vaginal secretions.  No pain with IC.  Breasts wnl.  Urine/BMs wnl.  BMI 21.25.  Physically active/healthy nutrition.  Fasting health labs here today.  History of thyroidectomy for thyroid cancer on Synthroid.  Past medical history,surgical history, family history and social history were all reviewed and documented in the EPIC chart.  Gynecologic History No LMP recorded. (Menstrual status: IUD). Contraception: IUD x 09/2015 Last Pap: 05/2018. Results were: normal per patient, done in [place] Last mammogram: 12/2016. Results were: Negative Bone Density: Never Colonoscopy: Never  Obstetric History OB History  Gravida Para Term Preterm AB Living  _0 SAB TAB Ectopic Multiple Live Births               # Outcome Date GA Lbr Len/2nd Weight Sex Delivery Anes PTL Lv  5 AB           4 AB           3 Para           2 Para           1 Para              ROS: A ROS was performed and pertinent positives and negatives are included in the history.  GENERAL: No fevers or chills. HEENT: No change in vision, no earache, sore throat or sinus congestion. NECK: No pain or stiffness. CARDIOVASCULAR: No chest pain or pressure. No palpitations. PULMONARY: No shortness of breath, cough or wheeze. GASTROINTESTINAL: No abdominal pain, nausea, vomiting or diarrhea, melena or bright red blood per rectum. GENITOURINARY: No urinary frequency, urgency, hesitancy or dysuria. MUSCULOSKELETAL: No joint or muscle pain, no back pain, no recent trauma. DERMATOLOGIC: No rash, no itching, no lesions. ENDOCRINE: No polyuria, polydipsia, no heat or cold intolerance. No recent change in weight. HEMATOLOGICAL: No anemia or easy bruising or bleeding.  NEUROLOGIC: No headache, seizures, numbness, tingling or weakness. PSYCHIATRIC: No depression, no loss of interest in normal activity or change in sleep pattern.     Exam:   Ht 4' 11.5" (1.511 m)   Wt 107 lb (48.5 kg)   BMI 21.25 kg/m   Body mass index is 21.25 kg/m.  General appearance : Well developed well nourished female. No acute distress HEENT: Eyes: no retinal hemorrhage or exudates,  Neck supple, trachea midline, no carotid bruits, no thyroidmegaly Lungs: Clear to auscultation, no rhonchi or wheezes, or rib retractions  Heart: Regular rate and rhythm, no murmurs or gallops Breast:Examined in sitting and supine position were symmetrical in appearance, no palpable masses or tenderness,  no skin retraction, no nipple inversion, no nipple discharge, no skin discoloration, no axillary or supraclavicular lymphadenopathy Abdomen: no palpable masses or tenderness, no rebound or guarding Extremities: no edema or skin discoloration or tenderness  Pelvic: Vulva: Normal             Vagina: No gross lesions or discharge  Cervix: No gross lesions or discharge.  IUD strings not seen.  Uterus  AV, normal size, shape and consistency, non-tender and mobile  Adnexa  Without masses or tenderness  Anus: Normal  Assessment/Plan:  47 y.o. female for annual exam   1. Well female exam with routine gynecological exam Normal gynecologic exam.  Recent Pap test July 2019 normal per patient, done in [place].  Breast exam normal.  Will schedule screening mammogram now, recommend 3D.  Fasting health labs here today.  TSH followed by Dr. Cruzita Lederer the endocrinologist. - CBC - Comp Met (CMET) - Lipid panel - VITAMIN D 25 Hydroxy (Vit-D Deficiency, Fractures)  2. Encounter for routine checking of intrauterine contraceptive device (IUD) Mirena IUD since November 2016.  Strings not seen.  Patient will follow-up to confirm normal intrauterine location of the IUD.  3. Intrauterine contraceptive device  threads lost, initial encounter Follow-up with a pelvic ultrasound to locate the IUD. - US Transvaginal Non-OB; Future  4. Bacterial vaginosis Frequent episodes of odor with vaginal discharge.  Not currently symptomatic.  One treatment of tinidazole sent to pharmacy to use as needed.  5. Papillary thyroid carcinoma (HCC) Status post thyroidectomy.  Followed by endocrinology Dr. Cruzita Lederer  Other orders - Calcium Carbonate-Vit D-Min (CALCIUM 1200 PO); Take by mouth. - tinidazole (TINDAMAX) 500 MG tablet; Take 4 tablets (2,000 mg total) by mouth daily for 2 days.  [name] MD, 1:41 PM 06/22/2018

## 2018-06-23 LAB — LIPID PANEL
Cholesterol: 159 mg/dL (ref <200)
HDL: 46 mg/dL — ABNORMAL LOW (ref >50)
LDL Cholesterol (Calc): 98 mg/dL (calc)
Non-HDL Cholesterol (Calc): 113 mg/dL (calc) (ref <130)
Total CHOL/HDL Ratio: 3.5 (calc) (ref <5.0)
Triglycerides: 66 mg/dL (ref <150)

## 2018-06-23 LAB — COMPREHENSIVE METABOLIC PANEL
AG Ratio: 1.4 (calc) (ref 1.0–2.5)
ALT: 12 U/L (ref 6–29)
AST: 18 U/L (ref 10–35)
Albumin: 4.2 g/dL (ref 3.6–5.1)
Alkaline phosphatase (APISO): 61 U/L (ref 33–115)
BUN: 12 mg/dL (ref 7–25)
CO2: 24 mmol/L (ref 20–32)
Calcium: 8.9 mg/dL (ref 8.6–10.2)
Chloride: 103 mmol/L (ref 98–110)
Creat: 0.66 mg/dL (ref 0.50–1.10)
Globulin: 2.9 g/dL (calc) (ref 1.9–3.7)
Glucose, Bld: 77 mg/dL (ref 65–99)
Potassium: 4.6 mmol/L (ref 3.5–5.3)
Sodium: 140 mmol/L (ref 135–146)
Total Bilirubin: 0.7 mg/dL (ref 0.2–1.2)
Total Protein: 7.1 g/dL (ref 6.1–8.1)

## 2018-06-23 LAB — CBC
HCT: 39.9 % (ref 35.0–45.0)
Hemoglobin: 13.2 g/dL (ref 11.7–15.5)
MCH: 29.9 pg (ref 27.0–33.0)
MCHC: 33.1 g/dL (ref 32.0–36.0)
MCV: 90.5 fL (ref 80.0–100.0)
MPV: 12.2 fL (ref 7.5–12.5)
Platelets: 230 10*3/uL (ref 140–400)
RBC: 4.41 10*6/uL (ref 3.80–5.10)
RDW: 13 % (ref 11.0–15.0)
WBC: 8.4 10*3/uL (ref 3.8–10.8)

## 2018-06-23 LAB — VITAMIN D 25 HYDROXY (VIT D DEFICIENCY, FRACTURES): Vit D, 25-Hydroxy: 33 ng/mL (ref 30–100)

## 2018-07-26 ENCOUNTER — Encounter: Payer: BLUE CROSS/BLUE SHIELD | Admitting: Gynecology

## 2018-08-31 ENCOUNTER — Ambulatory Visit: Payer: BLUE CROSS/BLUE SHIELD | Admitting: Internal Medicine

## 2018-08-31 ENCOUNTER — Encounter: Payer: Self-pay | Admitting: Internal Medicine

## 2018-08-31 ENCOUNTER — Encounter

## 2018-08-31 VITALS — BP 108/60 | HR 60 | Ht 59.5 in | Wt 110.0 lb

## 2018-08-31 DIAGNOSIS — Z23 Encounter for immunization: Secondary | ICD-10-CM

## 2018-08-31 DIAGNOSIS — C73 Malignant neoplasm of thyroid gland: Secondary | ICD-10-CM

## 2018-08-31 DIAGNOSIS — E890 Postprocedural hypothyroidism: Secondary | ICD-10-CM | POA: Diagnosis not present

## 2018-08-31 LAB — T4, FREE: Free T4: 1.08 ng/dL (ref 0.60–1.60)

## 2018-08-31 LAB — TSH: TSH: 0.2 u[IU]/mL — ABNORMAL LOW (ref 0.35–4.50)

## 2018-08-31 NOTE — Patient Instructions (Signed)
Please stop at the lab.  Please continue Synthroid 75 mcg daily.  Take the thyroid hormone every day, with water, at least 30 minutes before breakfast, separated by at least 4 hours from: - acid reflux medications - calcium - iron - multivitamins  Please come back for a follow-up appointment in 1 year.

## 2018-08-31 NOTE — Progress Notes (Signed)
Patient ID: Molly Prince, female   DOB: 1971-07-28, 47 y.o.   MRN: [id number]   HPI  Molly Prince is a 47 y.o.-year-old female, initially referred by Dr. Harlow Asa, for management of thyroid cancer and postsurgical hypothyroidism. Last visit 1 year ago  Pt. has been dx with thyroid cancer in 01/2016  Reviewed and addended thyroid cancer history: 10/27/2015: Thyroid ultrasound:  Right thyroid lobe Measurements: 3.9 x 1.0 x 1.1 cm. No nodules visualized.  Left thyroid lobe Measurements: 3.5 x 1.8 x 1.8 cm. 2.3 x 1.8 x 1.8 cm heterogeneous solid mass within the left thyroid lobe. Smaller adjacent 8 mm solid nodule.  Isthmus Thickness: 2 mm in thickness. No nodules visualized. 11/26/2015: FNA: PTC ([facility] category VI) 01/08/2016: Total thyroidectomy-Final pathology: Diagnosis Thyroid, thyroidectomy - PAPILLARY THYROID CARCINOMA, 2.4 CM. - CARCINOMA IS CONFINED TO THE THYROID. - THE SURGICAL RESECTION MARGINS ARE NEGATIVE FOR CARCINOMA. - SEE ONCOLOGY TABLE BELOW. Microscopic Comment THYROID Specimen: Thyroid. Procedure (including lymph node sampling if applicable): Total thyroidectomy. Specimen Integrity (intact/fragmented): Intact. Tumor focality: Unifocal Dominant tumor: Maximum tumor size (cm): 2.4 cm (gross measurement). Tumor laterality: Left lobe. Histologic type (including subtype and/or unique features as applicable): Papillary thyroid carcinoma. Tumor capsule: Not present. Extrathyroidal extension: Not identified. Capsular invasion with degree of invasion if present: N/A Margins: Negative for carcinoma. Lymphatic or vascular invasion: Not identified. Lymph nodes: # examined - 0; # positive; N/A Extracapsular extension (if applicable): Not identified. TNM code: pT2, pNX Non-neoplastic thyroid: No significant findings. (JBK:gt, 01/11/16) 06/30/2016: RAI treatment 72 mCi 07/08/2016: Post treatment whole-body scan: negative for metastases 09/12/2017: Neck ultrasound: No  residual/ recurrent tissue post thyroidectomy.  Thyroglobulin remains at the lower limit of detection: Lab Results  Component Value Date   THYROGLB 0.1 (L) 09/05/2017   THYROGLB 0.1 (L) 03/07/2017   THYROGLB 0.2 (L) 09/07/2016   THGAB <1 09/05/2017   THGAB <1 03/07/2017   THGAB <1 09/07/2016   Postsurgical hypothyroidism  Pt is on Synthroid d.a.w. 75 Mcg daily, taken: - in am - fasting - at least 30 min from b'fast - no  Fe, MVI, PPIs - stopped Calcium  - not on Biotin  I reviewed patient's TFTs: Lab Results  Component Value Date   TSH 1.69 09/05/2017   TSH 1.44 04/12/2017   TSH 0.13 (L) 03/07/2017   TSH 0.59 09/07/2016   TSH 0.35 03/07/2016   TSH 2.810 10/27/2015   TSH 2.344 09/22/2014   FREET4 0.87 09/05/2017   FREET4 1.20 04/12/2017   FREET4 1.28 03/07/2017   FREET4 0.85 09/07/2016   FREET4 1.11 03/07/2016    She has no FH of thyroid disorders. No FH of thyroid cancer. No h/o radiation tx to head or neck except for RAI treatment.  No herbal supplements. No Biotin use. No recent steroids use.   She was on calcium after the surgery, however, then calcium normalized. Stopped calcium.  Lab Results  Component Value Date   CALCIUM 8.9 06/22/2018   CALCIUM 9.0 09/05/2017   CALCIUM 8.4 (L) 02/02/2017   CALCIUM 8.8 09/07/2016   CALCIUM 7.8 (L) 01/09/2016   CALCIUM 8.8 (L) 01/01/2016   CALCIUM 8.7 10/27/2015   CALCIUM 8.9 09/22/2014   She does not have vitamin D deficiency: Lab Results  Component Value Date   VD25OH 33 06/22/2018   VD25OH 30.43 09/05/2017   VD25OH 30.10 03/07/2016   ROS: Constitutional: no weight gain/no weight loss, no fatigue, no subjective hyperthermia, no subjective hypothermia Eyes: no blurry vision, no xerophthalmia  ENT: no sore throat, no nodules palpated in neck, no dysphagia, no odynophagia, no hoarseness Cardiovascular: no CP/no SOB/no palpitations/no leg swelling Respiratory: no cough/no SOB/no wheezing Gastrointestinal: no N/no  V/no D/no C/no acid reflux Musculoskeletal: no muscle aches/no joint aches Skin: no rashes, no hair loss Neurological: no tremors/no numbness/no tingling/no dizziness  I reviewed pt's medications, allergies, PMH, social hx, family hx, and changes were documented in the history of present illness. Otherwise, unchanged from my initial visit note.  Past Medical History:  Diagnosis Date  . Cancer [facility])    papillary thyroid carcinoma    Past Surgical History:  Procedure Laterality Date  . THYROIDECTOMY N/A 01/08/2016   Procedure: TOTAL THYROIDECTOMY;  Surgeon: Armandina Gemma, MD;  Location: [place];  Service: General;  Laterality: N/A;   Social History   Social History  . Marital Status: married    Spouse Name: N/A  . Number of Children: 3   Occupational History  . n/a   Social History Main Topics  . Smoking status: Never Smoker   . Smokeless tobacco: Not on file  . Alcohol Use: 0.0 oz/week    0 Standard drinks or equivalent per week     Comment: social - Tequila  . Drug Use: No  . Sexual Activity: Yes     Comment: IUD- MIRENA   Social History Narrative   Age of menarche: 51 yrs   Gravida: 4 pregnancies                  3 children      Current Outpatient Medications on File Prior to Visit  Medication Sig Dispense Refill  . Calcium Carbonate-Vit D-Min (CALCIUM 1200 PO) Take by mouth.    . SYNTHROID 75 MCG tablet TAKE 1 TABLET(75 MCG) BY MOUTH DAILY BEFORE BREAKFAST 45 tablet 1   No current facility-administered medications on file prior to visit.    No Known Allergies Family History  Problem Relation Age of Onset  . Hypertension Mother    PE: There were no vitals taken for this visit. Wt Readings from Last 3 Encounters:  06/22/18 107 lb (48.5 kg)  09/05/17 109 lb (49.4 kg)  03/07/17 102 lb (46.3 kg)   Constitutional: normal weight, in NAD Eyes: PERRLA, EOMI, no exophthalmos ENT: moist mucous membranes, no masses palpated in neck, thyroidectomy scar healed, no  cervical lymphadenopathy Cardiovascular: RRR, No MRG Respiratory: CTA B Gastrointestinal: abdomen soft, NT, ND, BS+ Musculoskeletal: no deformities, strength intact in all 4 Skin: moist, warm, no rashes Neurological: no tremor with outstretched hands, DTR normal in all 4  ASSESSMENT: 1. Thyroid cancer - see HPI  2. Postsurgical Hypothyroidism  3. Hypocalcemia  PLAN:  1. Thyroid cancer - papillary -Thyroid cancer history was reviewed -Her surgical scar is completely healed and she does not complain of any neck masses -Her cancer has very good prognosis -Since the tumor was larger than 1.5 cm, RAI treatment was indicated for postop thyroid remnant ablation.  I explained to the patient that the main role of this is to facilitate monitoring in the long run, by checking thyroglobulin.  She had RAI with Thyrogen in 06/2016.  Posttreatment whole-body scan was negative for metastasis.  We obtained a neck ultrasound after last visit and this was negative for any masses, metastasis, recurrence. -Her thyroglobulin remains detectable, but at the lower limit of detection.  Antithyroglobulin antibodies were negative. -We will recheck thyroglobulin and ATA antibodies today  2. Patient with history of total thyroidectomy for thyroid cancer,  now with iatrogenic hypothyroidism - latest thyroid labs reviewed with pt >> normal  - she continues on LT4 d.a.w. 75 Mcg daily - pt feels good on this dose. - we discussed about taking the thyroid hormone every day, with water, >30 minutes before breakfast, separated by >4 hours from acid reflux medications, calcium, iron, multivitamins. Pt. is taking it correctly. - will check thyroid tests today: TSH and fT4 - If labs are abnormal, she will need to return for repeat TFTs in 1.5 months  3. Hypocalcemia -Patient had low calcium levels after the surgery and then they remained in the low normal range, including at last check 2 months ago.  She continues on 600 mg  calcium daily.  Her vitamin D level remains normal at last check 2 months ago -She denies perioral numbness or acral cramping  Component     Latest Ref Rng & Units 08/31/2018  Thyroglobulin     ng/mL 0.1 (L)  TSH     0.35 - 4.50 uIU/mL 0.20 (L)  T4,Free(Direct)     0.60 - 1.60 ng/dL 1.08  Thyroglobulin Ab     < or = 1 IU/mL <1   Thyroglobulin remains at the lower limit of detection.  TSH is slightly decreased but she is on a low dose of levothyroxine and she had normal TFTs in the past dose so I would like to repeat her tests in 1.5 to 2 months before changing the dose.  Philemon Kingdom, MD PhD [facility] Endocrinology

## 2018-09-03 ENCOUNTER — Telehealth: Payer: Self-pay | Admitting: Internal Medicine

## 2018-09-03 LAB — THYROGLOBULIN LEVEL: Thyroglobulin: 0.1 ng/mL — ABNORMAL LOW

## 2018-09-03 LAB — THYROGLOBULIN ANTIBODY: Thyroglobulin Ab: <1 IU/mL (ref <=1)

## 2018-09-03 NOTE — Telephone Encounter (Signed)
Patient called re: Patient requests to be called at ph# [phone] to give patient lab results and to advise patient if medication (Synthroid) is going to remain the same or be changed.

## 2018-09-03 NOTE — Telephone Encounter (Signed)
Thyroglobulin remains very low, which is good.  TSH is slightly lower than normal but she is on a low dose of levothyroxine and she had normal TFTs in the past dose so I would like to repeat her tests in 1.5 to 2 months before changing the dose.  I ordered the test.

## 2018-09-04 NOTE — Telephone Encounter (Signed)
Notified patient of message from Dr. Gherghe, patient expressed understanding and agreement. No further questions.  

## 2018-09-06 ENCOUNTER — Ambulatory Visit: Payer: BLUE CROSS/BLUE SHIELD | Admitting: Internal Medicine

## 2018-10-23 ENCOUNTER — Other Ambulatory Visit (INDEPENDENT_AMBULATORY_CARE_PROVIDER_SITE_OTHER): Payer: BLUE CROSS/BLUE SHIELD

## 2018-10-23 DIAGNOSIS — E890 Postprocedural hypothyroidism: Secondary | ICD-10-CM

## 2018-10-23 LAB — T4, FREE: Free T4: 1.17 ng/dL (ref 0.60–1.60)

## 2018-10-23 LAB — TSH: TSH: 0.54 u[IU]/mL (ref 0.35–4.50)

## 2018-10-24 ENCOUNTER — Telehealth: Payer: Self-pay | Admitting: Internal Medicine

## 2018-10-24 ENCOUNTER — Other Ambulatory Visit: Payer: BLUE CROSS/BLUE SHIELD

## 2018-10-24 NOTE — Telephone Encounter (Signed)
Patient called wanting to know if her medication needs to change due to the results of her recent lab.

## 2018-10-24 NOTE — Telephone Encounter (Signed)
Spoke with patient and went over her lab results-patient understood this and has no questions at this time

## 2018-12-07 ENCOUNTER — Other Ambulatory Visit: Payer: Self-pay | Admitting: Obstetrics & Gynecology

## 2018-12-07 DIAGNOSIS — Z1231 Encounter for screening mammogram for malignant neoplasm of breast: Secondary | ICD-10-CM

## 2018-12-11 ENCOUNTER — Ambulatory Visit
Admission: RE | Admit: 2018-12-11 | Discharge: 2018-12-11 | Disposition: A | Discharge status: Home / Self Care | Payer: BLUE CROSS/BLUE SHIELD | Source: Ambulatory Visit | Attending: Obstetrics & Gynecology | Admitting: Obstetrics & Gynecology

## 2018-12-11 DIAGNOSIS — Z1231 Encounter for screening mammogram for malignant neoplasm of breast: Secondary | ICD-10-CM | POA: Diagnosis not present

## 2019-03-13 ENCOUNTER — Telehealth: Payer: Self-pay | Admitting: Internal Medicine

## 2019-03-13 NOTE — Telephone Encounter (Signed)
Patient has called in regards to feeling very tired and having headaches here more recently, states she believes its her Synthroid. Patient will need an interpreter.  Please Advise, Thanks

## 2019-03-13 NOTE — Telephone Encounter (Signed)
M, let's have her back for TSH and free T4. Can you please order these?

## 2019-03-13 NOTE — Telephone Encounter (Signed)
Last office visit and lab Dec 2019.   Labs normal and patient to stay on same dose.  Please advise if appointment or labs needed. We will need an interpreter.

## 2019-04-08 ENCOUNTER — Other Ambulatory Visit: Payer: Self-pay

## 2019-04-08 ENCOUNTER — Encounter: Payer: Self-pay | Admitting: Obstetrics & Gynecology

## 2019-04-08 ENCOUNTER — Ambulatory Visit: Payer: BLUE CROSS/BLUE SHIELD | Admitting: Obstetrics & Gynecology

## 2019-04-08 VITALS — BP 124/80

## 2019-04-08 DIAGNOSIS — N6313 Unspecified lump in the right breast, lower outer quadrant: Secondary | ICD-10-CM

## 2019-04-08 DIAGNOSIS — N6311 Unspecified lump in the right breast, upper outer quadrant: Secondary | ICD-10-CM

## 2019-04-08 NOTE — Progress Notes (Signed)
    Molly Prince April 14, 1971 [id number]        48 y.o.  [id number]   RP:  Rt breast lump x 12 days  HPI: Felt a Rt breast lump x 12 days.  No change in size since then.  Mildly tender.  No change in skin.  No nipple discharge.  Had a negative screening Mammo at the [facility] on 12/11/2018.  Family Hx is negative for Breast Cancer.  Well on Mirena IUD x 09/2015.  Thyroidectomy for Thyroid Cancer 2017.   OB History  Gravida Para Term Preterm AB Living  5 3     2 3   SAB TAB Ectopic Multiple Live Births               # Outcome Date GA Lbr Len/2nd Weight Sex Delivery Anes PTL Lv  5 AB           4 AB           3 Para           2 Para           1 Para             Past medical history,surgical history, problem list, medications, allergies, family history and social history were all reviewed and documented in the EPIC chart.   Directed ROS with pertinent positives and negatives documented in the history of present illness/assessment and plan.  Exam:  Vitals:   04/08/19 1433  BP: 124/80   General appearance:  Normal  Breast exam:  Left breast normal.  Left axilla normal.                         Right breast nodule at 8 O'Clock 1 x 1.5 cm and 10 O'Clock 1 x 0.5 cm, both mobile, mildly tender, regular contour, no skin change, no nipple d/c.  Right axilla normal.   Assessment/Plan:  48 y.o. G5P0023   1. Breast lump on right side at 8 o'clock position Right breast nodule measuring 1 x 1.5 cm at 8:00, regular contour and mobile with mild tenderness.  Probably benign but will investigate further with a right diagnostic mammogram and ultrasound.  No familial history of breast cancer.  Mammogram in February 2020 was negative.  2. Breast lump on right side at 10 o'clock position Second smaller nodule on the right breast at 10:00 measuring 1 cm x 0.5 cm, irregular contour, mobile, mildly tender.  Management as above.  Counseling on above issues and coordination of care more than 50% for  25 minutes.  [name] MD, 2:46 PM 04/08/2019

## 2019-04-09 ENCOUNTER — Encounter: Payer: Self-pay | Admitting: Obstetrics & Gynecology

## 2019-04-09 ENCOUNTER — Telehealth: Payer: Self-pay | Admitting: *Deleted

## 2019-04-09 DIAGNOSIS — N6311 Unspecified lump in the right breast, upper outer quadrant: Secondary | ICD-10-CM

## 2019-04-09 DIAGNOSIS — N6313 Unspecified lump in the right breast, lower outer quadrant: Secondary | ICD-10-CM

## 2019-04-09 NOTE — Telephone Encounter (Signed)
-----   Message from [name], MD sent at 04/08/2019  3:01 PM EDT ----- Regarding: Schedule Dx mammo/US at [facility] breast lumps x 12 days.  8 O'Clock 1.5 x 1 cm and 10 O'Clock 1 x 0.5 cm, mobile, regular contour, mildly tender.  No breast Cancer in family.  Schedule Rt Dx mammo/US.  Would like at or after 3 pm.

## 2019-04-09 NOTE — Patient Instructions (Signed)
1. Breast lump on right side at 8 o'clock position Right breast nodule measuring 1 x 1.5 cm at 8:00, regular contour and mobile with mild tenderness.  Probably benign but will investigate further with a right diagnostic mammogram and ultrasound.  No familial history of breast cancer.  Mammogram in February 2020 was negative.  2. Breast lump on right side at 10 o'clock position Second smaller nodule on the right breast at 10:00 measuring 1 cm x 0.5 cm, irregular contour, mobile, mildly tender.  Management as above.  Molly Prince, it was a pleasure seeing you today!

## 2019-04-09 NOTE — Telephone Encounter (Signed)
Patient scheduled at breast center on 04/12/19 @ 3:00pm she will need a mask at visit and come alone to visit, bring insurance card as well.   Will route to [place] to relay.

## 2019-04-09 NOTE — Telephone Encounter (Signed)
Patient informed. 

## 2019-04-12 ENCOUNTER — Other Ambulatory Visit: Payer: Self-pay

## 2019-04-12 ENCOUNTER — Ambulatory Visit
Admission: RE | Admit: 2019-04-12 | Discharge: 2019-04-12 | Disposition: A | Discharge status: Home / Self Care | Payer: BC Managed Care – PPO | Source: Ambulatory Visit | Attending: Obstetrics & Gynecology | Admitting: Obstetrics & Gynecology

## 2019-04-12 ENCOUNTER — Ambulatory Visit
Admission: RE | Admit: 2019-04-12 | Discharge: 2019-04-12 | Disposition: A | Discharge status: Home / Self Care | Payer: BLUE CROSS/BLUE SHIELD | Source: Ambulatory Visit | Attending: Obstetrics & Gynecology | Admitting: Obstetrics & Gynecology

## 2019-04-12 DIAGNOSIS — N6313 Unspecified lump in the right breast, lower outer quadrant: Secondary | ICD-10-CM

## 2019-04-12 DIAGNOSIS — N6011 Diffuse cystic mastopathy of right breast: Secondary | ICD-10-CM | POA: Diagnosis not present

## 2019-04-12 DIAGNOSIS — N6311 Unspecified lump in the right breast, upper outer quadrant: Secondary | ICD-10-CM

## 2019-04-12 DIAGNOSIS — R922 Inconclusive mammogram: Secondary | ICD-10-CM | POA: Diagnosis not present

## 2019-06-24 ENCOUNTER — Other Ambulatory Visit: Payer: Self-pay

## 2019-06-25 ENCOUNTER — Encounter: Payer: Self-pay | Admitting: Obstetrics & Gynecology

## 2019-06-25 ENCOUNTER — Ambulatory Visit: Payer: BC Managed Care – PPO | Admitting: Obstetrics & Gynecology

## 2019-06-25 VITALS — BP 120/78 | Ht 59.0 in | Wt 107.0 lb

## 2019-06-25 DIAGNOSIS — Z30431 Encounter for routine checking of intrauterine contraceptive device: Secondary | ICD-10-CM

## 2019-06-25 DIAGNOSIS — Z01419 Encounter for gynecological examination (general) (routine) without abnormal findings: Secondary | ICD-10-CM

## 2019-06-25 DIAGNOSIS — C73 Malignant neoplasm of thyroid gland: Secondary | ICD-10-CM

## 2019-06-25 DIAGNOSIS — Z85850 Personal history of malignant neoplasm of thyroid: Secondary | ICD-10-CM | POA: Diagnosis not present

## 2019-06-25 NOTE — Progress Notes (Signed)
Molly Prince 08-14-1971 [id number]   History:    48 y.o. G5P3A2L3 Married.  Children: 37 yo daughter in [place], 24 yo and 49 yo  RP:  Established patient presenting for annual gyn exam   HPI: Well on Mirena IUD x 09/2015.  No menses, no BTB.  No pelvic pain.  No pain with IC.  Urine/BMs normal. Breasts wnl, no pain and no lumps currently.  Rx Dx mammo benign 04/2019. BMI 21.61.  Physically active, walking and swimming/healthy nutrition.  Fasting health labs here today.  History of thyroidectomy for thyroid cancer on Synthroid.  Past medical history,surgical history, family history and social history were all reviewed and documented in the EPIC chart.  Gynecologic History No LMP recorded. (Menstrual status: IUD). Contraception: Mirena IUD x 09/2015 Last Pap: 01/2017. Results were: Negative Last mammogram: 12/2018. Results were: Negative.  Rt Dx Mammo 04/2019 benign. Bone Density: Never Colonoscopy: Never  Obstetric History OB History  Gravida Para Term Preterm AB Living  _0 SAB TAB Ectopic Multiple Live Births               # Outcome Date GA Lbr Len/2nd Weight Sex Delivery Anes PTL Lv  5 AB           4 AB           3 Para           2 Para           1 Para              ROS: A ROS was performed and pertinent positives and negatives are included in the history.  GENERAL: No fevers or chills. HEENT: No change in vision, no earache, sore throat or sinus congestion. NECK: No pain or stiffness. CARDIOVASCULAR: No chest pain or pressure. No palpitations. PULMONARY: No shortness of breath, cough or wheeze. GASTROINTESTINAL: No abdominal pain, nausea, vomiting or diarrhea, melena or bright red blood per rectum. GENITOURINARY: No urinary frequency, urgency, hesitancy or dysuria. MUSCULOSKELETAL: No joint or muscle pain, no back pain, no recent trauma. DERMATOLOGIC: No rash, no itching, no lesions. ENDOCRINE: No polyuria, polydipsia, no heat or cold intolerance. No recent change in  weight. HEMATOLOGICAL: No anemia or easy bruising or bleeding. NEUROLOGIC: No headache, seizures, numbness, tingling or weakness. PSYCHIATRIC: No depression, no loss of interest in normal activity or change in sleep pattern.     Exam:   BP 120/78 (BP Location: Right Arm, Patient Position: Sitting, Cuff Size: Normal)   Ht _1  (1.499 m)   Wt 107 lb (48.5 kg)   BMI 21.61 kg/m   Body mass index is 21.61 kg/m.  General appearance : Well developed well nourished female. No acute distress HEENT: Eyes: no retinal hemorrhage or exudates,  Neck supple, trachea midline, no carotid bruits, no thyroidmegaly Lungs: Clear to auscultation, no rhonchi or wheezes, or rib retractions  Heart: Regular rate and rhythm, no murmurs or gallops Breast:Examined in sitting and supine position were symmetrical in appearance, no palpable masses or tenderness,  no skin retraction, no nipple inversion, no nipple discharge, no skin discoloration, no axillary or supraclavicular lymphadenopathy Abdomen: no palpable masses or tenderness, no rebound or guarding Extremities: no edema or skin discoloration or tenderness  Pelvic: Vulva: Normal             Vagina: No gross lesions or discharge  Cervix: No gross lesions or discharge.  IUD strings short, but  visible.  Pap reflex done.  Uterus  AV, normal size, shape and consistency, non-tender and mobile  Adnexa  Without masses or tenderness  Anus: Normal   Assessment/Plan:  48 y.o. female for annual exam   1. Encounter for routine gynecological examination with Papanicolaou smear of cervix Normal gynecologic exam.  Pap reflex done.  Breast exam normal.  Screening mammogram in February 2020 was benign.  Good body mass index at 21.61.  Continue with fitness and healthy nutrition.  Fasting health labs here today. - CBC - Comp Met (CMET) - TSH - Lipid panel - VITAMIN D 25 Hydroxy (Vit-D Deficiency, Fractures)  2. Encounter for routine checking of intrauterine  contraceptive device (IUD) Well on Mirena IUD since November 2016.  Minott IUD in good position.  3. Papillary thyroid carcinoma ([place])  Other orders - levonorgestrel (MIRENA) 20 MCG/24HR IUD; 1 each by Intrauterine route once.  [name] MD, 10:42 AM 06/25/2019

## 2019-06-26 LAB — COMPREHENSIVE METABOLIC PANEL
AG Ratio: 1.6 (calc) (ref 1.0–2.5)
ALT: 17 U/L (ref 6–29)
AST: 19 U/L (ref 10–35)
Albumin: 4.1 g/dL (ref 3.6–5.1)
Alkaline phosphatase (APISO): 46 U/L (ref 31–125)
BUN: 11 mg/dL (ref 7–25)
CO2: 24 mmol/L (ref 20–32)
Calcium: 9 mg/dL (ref 8.6–10.2)
Chloride: 104 mmol/L (ref 98–110)
Creat: 0.67 mg/dL (ref 0.50–1.10)
Globulin: 2.6 g/dL (calc) (ref 1.9–3.7)
Glucose, Bld: 73 mg/dL (ref 65–99)
Potassium: 5.1 mmol/L (ref 3.5–5.3)
Sodium: 138 mmol/L (ref 135–146)
Total Bilirubin: 0.4 mg/dL (ref 0.2–1.2)
Total Protein: 6.7 g/dL (ref 6.1–8.1)

## 2019-06-26 LAB — CBC
HCT: 38.8 % (ref 35.0–45.0)
Hemoglobin: 13 g/dL (ref 11.7–15.5)
MCH: 30.2 pg (ref 27.0–33.0)
MCHC: 33.5 g/dL (ref 32.0–36.0)
MCV: 90 fL (ref 80.0–100.0)
MPV: 11.9 fL (ref 7.5–12.5)
Platelets: 210 10*3/uL (ref 140–400)
RBC: 4.31 10*6/uL (ref 3.80–5.10)
RDW: 13 % (ref 11.0–15.0)
WBC: 9.6 10*3/uL (ref 3.8–10.8)

## 2019-06-26 LAB — PAP IG W/ RFLX HPV ASCU

## 2019-06-26 LAB — LIPID PANEL
Cholesterol: 160 mg/dL (ref <200)
HDL: 55 mg/dL (ref >=50)
LDL Cholesterol (Calc): 89 mg/dL (calc)
Non-HDL Cholesterol (Calc): 105 mg/dL (calc) (ref <130)
Total CHOL/HDL Ratio: 2.9 (calc) (ref <5.0)
Triglycerides: 75 mg/dL (ref <150)

## 2019-06-26 LAB — VITAMIN D 25 HYDROXY (VIT D DEFICIENCY, FRACTURES): Vit D, 25-Hydroxy: 35 ng/mL (ref 30–100)

## 2019-06-26 LAB — TSH: TSH: 0.1 mIU/L — ABNORMAL LOW

## 2019-07-01 ENCOUNTER — Encounter: Payer: Self-pay | Admitting: Obstetrics & Gynecology

## 2019-07-01 NOTE — Patient Instructions (Signed)
1. Encounter for routine gynecological examination with Papanicolaou smear of cervix Normal gynecologic exam.  Pap reflex done.  Breast exam normal.  Screening mammogram in February 2020 was benign.  Good body mass index at 21.61.  Continue with fitness and healthy nutrition.  Fasting health labs here today. - CBC - Comp Met (CMET) - TSH - Lipid panel - VITAMIN D 25 Hydroxy (Vit-D Deficiency, Fractures)  2. Encounter for routine checking of intrauterine contraceptive device (IUD) Well on Mirena IUD since November 2016.  Minott IUD in good position.  3. Papillary thyroid carcinoma ([place])  Other orders - levonorgestrel (MIRENA) 20 MCG/24HR IUD; 1 each by Intrauterine route once.  Myrene, it was a pleasure seeing you today!  I will inform you of your results as soon as they are available.

## 2019-07-05 ENCOUNTER — Telehealth: Payer: Self-pay | Admitting: Internal Medicine

## 2019-07-05 DIAGNOSIS — C73 Malignant neoplasm of thyroid gland: Secondary | ICD-10-CM

## 2019-07-05 DIAGNOSIS — E890 Postprocedural hypothyroidism: Secondary | ICD-10-CM

## 2019-07-05 NOTE — Telephone Encounter (Signed)
Per patient at her GYN appointment they told her that her Thyroid levels were off and that she should contact Dr Cruzita Lederer to see if he medication needs to be adjusted.  Phone: [phone]

## 2019-07-05 NOTE — Telephone Encounter (Signed)
Her TSH was indeed slightly low, but she is already on such a low dose of levothyroxine that I would like to repeat a TSH and a free T4 in 5 weeks before changing the dose.  Can you please order these and let her know?  Please let me know if she is having any symptoms or feeling differently.

## 2019-07-08 NOTE — Telephone Encounter (Signed)
She is having headaches and neck is heavy everyday

## 2019-07-08 NOTE — Telephone Encounter (Signed)
I am not sure about the "heavy neck- does not appear to be related to her thyroid (she had total thyroidectomy) - may need to check with PCP. For now, as her TSH was low and she is having HAs, we can try to decrease Synthroid to 50 alternating with 75 mcg every other day and recheck TSH and fT4 in 1.5 months after the change. Can you please change her medlist and order the above labs?

## 2019-07-08 NOTE — Telephone Encounter (Signed)
Please call in the 50 mcg synthyroid for her to [facility]

## 2019-07-08 NOTE — Telephone Encounter (Signed)
Pt is aware of the changes in the medication

## 2019-07-09 MED ORDER — LEVOTHYROXINE SODIUM 50 MCG PO TABS: ORAL_TABLET | ORAL | 3 refills | Status: DC

## 2019-07-09 NOTE — Telephone Encounter (Signed)
RX sent and labs ordered.

## 2019-07-09 NOTE — Addendum Note (Signed)
Addended by: Cardell Peach I on: 07/09/2019 09:58 AM   Modules accepted: Orders

## 2019-07-31 ENCOUNTER — Encounter: Payer: Self-pay | Admitting: Gynecology

## 2019-08-22 ENCOUNTER — Other Ambulatory Visit: Payer: Self-pay

## 2019-08-22 ENCOUNTER — Other Ambulatory Visit (INDEPENDENT_AMBULATORY_CARE_PROVIDER_SITE_OTHER): Payer: BC Managed Care – PPO

## 2019-08-22 ENCOUNTER — Other Ambulatory Visit: Payer: Self-pay | Admitting: Internal Medicine

## 2019-08-22 DIAGNOSIS — E890 Postprocedural hypothyroidism: Secondary | ICD-10-CM | POA: Diagnosis not present

## 2019-08-22 DIAGNOSIS — C73 Malignant neoplasm of thyroid gland: Secondary | ICD-10-CM

## 2019-08-22 LAB — TSH: TSH: 0.78 u[IU]/mL (ref 0.35–4.50)

## 2019-08-22 LAB — T4, FREE: Free T4: 0.89 ng/dL (ref 0.60–1.60)

## 2019-08-22 MED ORDER — SYNTHROID 75 MCG PO TABS: ORAL_TABLET | ORAL | 3 refills | Status: DC

## 2019-08-22 MED ORDER — SYNTHROID 50 MCG PO TABS: ORAL_TABLET | ORAL | 3 refills | Status: DC

## 2019-08-27 ENCOUNTER — Encounter: Payer: Self-pay | Admitting: Family Medicine

## 2019-08-27 ENCOUNTER — Other Ambulatory Visit: Payer: Self-pay

## 2019-08-27 ENCOUNTER — Ambulatory Visit (INDEPENDENT_AMBULATORY_CARE_PROVIDER_SITE_OTHER): Payer: BC Managed Care – PPO | Admitting: Family Medicine

## 2019-08-27 DIAGNOSIS — M542 Cervicalgia: Secondary | ICD-10-CM | POA: Diagnosis not present

## 2019-08-27 MED ORDER — BACLOFEN 10 MG PO TABS: 10.0000 mg | ORAL_TABLET | Freq: Three times a day (TID) | ORAL | 0 refills | Status: DC

## 2019-08-27 NOTE — Progress Notes (Signed)
Virtual Visit via Video Note   I connected with Molly Prince on 08/27/19 by a video enabled telemedicine application and verified that I am speaking with the correct person using two identifiers.  Location patient: home Location provider:work office Persons participating in the virtual visit: patient, provider  I discussed the limitations of evaluation and management by telemedicine and the availability of in person appointments. The patient expressed understanding and agreed to proceed.   HPI: Molly Prince is a 48 years old female with history of papillary thyroid carcinoma, status post post total thyroidectomy, who is complaining about 2 months of neck pain.  She denies trauma or unusual activities that could have caused pain.  Pain is intermittent, 2-3 times per week. "Tension" like pain, 6/10, no radiated. She wakes up with pain. Pain is exacerbated by certain movements. Negative for limitation of range of motion, pain does not interfere with her daily activities or with sleep.  NSAID's and massage help for about 2 days with no pain.  She denies upper extremity numbness, tingling, or weakness. She has not noted local edema,erythema,or rash.  Negative for fever, chills, sore throat, fatigue,body aches, or headache. States that her head feels "heavy" in the morning when she starts with neck pain.  Problem is stable.  ROS: See pertinent positives and negatives per HPI.  Past Medical History:  Diagnosis Date  . Cancer [facility]    papillary thyroid carcinoma     Past Surgical History:  Procedure Laterality Date  . THYROIDECTOMY N/A 01/08/2016   Procedure: TOTAL THYROIDECTOMY;  Surgeon: Armandina Gemma, MD;  Location: [facility] OR;  Service: General;  Laterality: N/A;    Family History  Problem Relation Age of Onset  . Hypertension Mother     Social History   Socioeconomic History  . Marital status: Single    Spouse name: Not on file  . Number of children: Not on file  . Years of  education: Not on file  . Highest education level: Not on file  Occupational History  . Not on file  Social Needs  . Financial resource strain: Not on file  . Food insecurity    Worry: Not on file    Inability: Not on file  . Transportation needs    Medical: Not on file    Non-medical: Not on file  Tobacco Use  . Smoking status: Never Smoker  . Smokeless tobacco: Never Used  Substance and Sexual Activity  . Alcohol use: Not Currently    Alcohol/week: 0.0 standard drinks  . Drug use: No  . Sexual activity: Yes    Partners: Male    Birth control/protection: None, I.U.D.    Comment: IUD- MIRENA inserted 09-2015  Lifestyle  . Physical activity    Days per week: Not on file    Minutes per session: Not on file  . Stress: Not on file  Relationships  . Social Herbalist on phone: Not on file    Gets together: Not on file    Attends religious service: Not on file    Active member of club or organization: Not on file    Attends meetings of clubs or organizations: Not on file    Relationship status: Not on file  . Intimate partner violence    Fear of current or ex partner: Not on file    Emotionally abused: Not on file    Physically abused: Not on file    Forced sexual activity: Not on file  Other Topics  Concern  . Not on file  Social History Narrative   Age of menarche: 46 yrs   Gravida: 4 pregnancies                  3 children    Current Outpatient Medications:  .  baclofen (LIORESAL) 10 MG tablet, Take 1 tablet (10 mg total) by mouth 3 (three) times daily., Disp: 30 each, Rfl: 0 .  levonorgestrel (MIRENA) 20 MCG/24HR IUD, 1 each by Intrauterine route once., Disp: , Rfl:  .  SYNTHROID 50 MCG tablet, Take 50 mcg by mouth every other day., Disp: 45 tablet, Rfl: 3 .  SYNTHROID 75 MCG tablet, Take 75 mcg by mouth every other day, Disp: 45 tablet, Rfl: 3  EXAM:  VITALS per patient if applicable:N/A  GENERAL: alert, oriented, appears well and in no acute  distress  HEENT: atraumatic, conjunctiva clear, no obvious abnormalities on inspection.  NECK: normal movements of the head and neck  LUNGS: on inspection no signs of respiratory distress, breathing rate appears normal, no obvious gross SOB, gasping or wheezing  CV: no obvious cyanosis  Molly: moves all visible extremities without noticeable abnormality. No limitation of cervical spine ROM, no pain when she palpates cervical paraspinal muscles and pain is not elicited with movement.  PSYCH/NEURO: pleasant and cooperative, no obvious depression or anxiety, speech and thought processing grossly intact  ASSESSMENT AND PLAN:  Discussed the following assessment and plan:  Cervicalgia - Plan: baclofen (LIORESAL) 10 MG tablet, Ambulatory referral to Physical Therapy  Most likely musculoskeletal related. History does not suggest a serious process. Because no history of trauma I do not think imaging is needed at this time. Baclofen 10 mg at bedtime for 15 days may help, side effects discussed. Local massage and ice recommended. OTC IcyHot may also help. PT evaluation will be arranged. Instructed about warning signs.    I discussed the assessment and treatment plan with the patient. She was provided an opportunity to ask questions and all were answered. She agreed with the plan and demonstrated an understanding of the instructions.   The patient was advised to call back or seek an in-person evaluation if the symptoms worsen or if the condition fails to improve as anticipated.  Return if symptoms worsen or fail to improve.    Molly Martinique, MD

## 2019-08-28 ENCOUNTER — Ambulatory Visit (INDEPENDENT_AMBULATORY_CARE_PROVIDER_SITE_OTHER): Payer: BC Managed Care – PPO | Admitting: Internal Medicine

## 2019-08-28 ENCOUNTER — Other Ambulatory Visit: Payer: Self-pay

## 2019-08-28 ENCOUNTER — Encounter: Payer: Self-pay | Admitting: Internal Medicine

## 2019-08-28 VITALS — BP 100/70 | HR 62 | Ht 59.5 in | Wt 110.0 lb

## 2019-08-28 DIAGNOSIS — Z23 Encounter for immunization: Secondary | ICD-10-CM

## 2019-08-28 DIAGNOSIS — C73 Malignant neoplasm of thyroid gland: Secondary | ICD-10-CM | POA: Diagnosis not present

## 2019-08-28 DIAGNOSIS — E890 Postprocedural hypothyroidism: Secondary | ICD-10-CM | POA: Diagnosis not present

## 2019-08-28 MED ORDER — SYNTHROID 75 MCG PO TABS: ORAL_TABLET | ORAL | 3 refills | Status: DC

## 2019-08-28 MED ORDER — SYNTHROID 50 MCG PO TABS: ORAL_TABLET | ORAL | 3 refills | Status: DC

## 2019-08-28 NOTE — Progress Notes (Addendum)
Patient ID: Molly Prince, female   DOB: [birth year], 48 y.o.   MRN: [id number]   HPI  Molly Prince is a 48 y.o.-year-old female, initially referred by Dr. Harlow Asa, for management of thyroid cancer and postsurgical hypothyroidism. Last visit 1 year ago.  She was recently diagnosed with tension HAs >> given mm relaxant.   She initially believed that the headaches were related to her thyroid hormones and she contacted [place] about this and also about the increased fatigue in 03/2019.  The symptoms have now resolved.  Patient was diagnosed with papillary thyroid cancer in 01/2016.  Reviewed her thyroid cancer history: 10/27/2015: Thyroid ultrasound:  Right thyroid lobe Measurements: 3.9 x 1.0 x 1.1 cm. No nodules visualized.  Left thyroid lobe Measurements: 3.5 x 1.8 x 1.8 cm. 2.3 x 1.8 x 1.8 cm heterogeneous solid mass within the left thyroid lobe. Smaller adjacent 8 mm solid nodule.  Isthmus Thickness: 2 mm in thickness. No nodules visualized. 11/26/2015: FNA: PTC ([facility] category VI) 01/08/2016: Total thyroidectomy-Final pathology: Diagnosis Thyroid, thyroidectomy - PAPILLARY THYROID CARCINOMA, 2.4 CM. - CARCINOMA IS CONFINED TO THE THYROID. - THE SURGICAL RESECTION MARGINS ARE NEGATIVE FOR CARCINOMA. - SEE ONCOLOGY TABLE BELOW. Microscopic Comment THYROID Specimen: Thyroid. Procedure (including lymph node sampling if applicable): Total thyroidectomy. Specimen Integrity (intact/fragmented): Intact. Tumor focality: Unifocal Dominant tumor: Maximum tumor size (cm): 2.4 cm (gross measurement). Tumor laterality: Left lobe. Histologic type (including subtype and/or unique features as applicable): Papillary thyroid carcinoma. Tumor capsule: Not present. Extrathyroidal extension: Not identified. Capsular invasion with degree of invasion if present: N/A Margins: Negative for carcinoma. Lymphatic or vascular invasion: Not identified. Lymph nodes: # examined - 0; # positive;  N/A Extracapsular extension (if applicable): Not identified. TNM code: pT2, pNX Non-neoplastic thyroid: No significant findings. (JBK:gt, 01/11/16) 06/30/2016: RAI treatment 72 mCi 07/08/2016: Post treatment whole-body scan: negative for metastases 09/12/2017: Neck ultrasound: No residual/ recurrent tissue post thyroidectomy.  Thyroglobulin remains at the lower limit of detection: Lab Results  Component Value Date   THYROGLB 0.1 (L) 08/31/2018   THYROGLB 0.1 (L) 09/05/2017   THYROGLB 0.1 (L) 03/07/2017   THYROGLB 0.2 (L) 09/07/2016   THGAB <1 08/31/2018   THGAB <1 09/05/2017   THGAB <1 03/07/2017   THGAB <1 09/07/2016   Postsurgical hypothyroidism  Molly Prince is on Synthroid d.a.w. 50 alternating with 75 mcg every other day (dose decreased from 75 mcg daily at last visit): - in am - fasting - at least 30 min from b'fast - no Ca, Fe, PPIs - + MVI at night (w/o iron) - not on Biotin  Reviewed patient's TFTs: Lab Results  Component Value Date   TSH 0.78 08/22/2019   TSH 0.10 (L) 06/25/2019   TSH 0.54 10/23/2018   TSH 0.20 (L) 08/31/2018   TSH 1.69 09/05/2017   TSH 1.44 04/12/2017   TSH 0.13 (L) 03/07/2017   TSH 0.59 09/07/2016   TSH 0.35 03/07/2016   TSH 2.810 10/27/2015   FREET4 0.89 08/22/2019   FREET4 1.17 10/23/2018   FREET4 1.08 08/31/2018   FREET4 0.87 09/05/2017   FREET4 1.20 04/12/2017   FREET4 1.28 03/07/2017   FREET4 0.85 09/07/2016   FREET4 1.11 03/07/2016    She has no FH of thyroid disorders.No FH of thyroid cancer. No h/o radiation tx to head or neck except for RAI treatment.  No herbal supplements. No Biotin use. No recent steroids use.   She was on calcium after the surgery but calcium normalized afterwards so she is now  off.  Lab Results  Component Value Date   CALCIUM 9.0 06/25/2019   CALCIUM 8.9 06/22/2018   CALCIUM 9.0 09/05/2017   CALCIUM 8.4 (L) 02/02/2017   CALCIUM 8.8 09/07/2016   CALCIUM 7.8 (L) 01/09/2016   CALCIUM 8.8 (L) 01/01/2016    CALCIUM 8.7 10/27/2015   CALCIUM 8.9 09/22/2014   No history of vitamin D deficiency: Lab Results  Component Value Date   VD25OH 35 06/25/2019   VD25OH 33 06/22/2018   VD25OH 30.43 09/05/2017   VD25OH 30.10 03/07/2016   ROS: Constitutional: no weight gain/no weight loss, no fatigue, no subjective hyperthermia, no subjective hypothermia Eyes: no blurry vision, no xerophthalmia ENT: no sore throat, + see HPI Cardiovascular: no CP/no SOB/no palpitations/no leg swelling Respiratory: no cough/no SOB/no wheezing Gastrointestinal: no N/no V/no D/no C/no acid reflux Musculoskeletal: no muscle aches/no joint aches Skin: no rashes, no hair loss Neurological: no tremors/no numbness/no tingling/no dizziness, + resolved headaches  I reviewed Molly Prince's medications, allergies, PMH, social hx, family hx, and changes were documented in the history of present illness. Otherwise, unchanged from my initial visit note.  Past Medical History:  Diagnosis Date  . Cancer [facility])    papillary thyroid carcinoma    Past Surgical History:  Procedure Laterality Date  . THYROIDECTOMY N/A 01/08/2016   Procedure: TOTAL THYROIDECTOMY;  Surgeon: Armandina Gemma, MD;  Location: [place];  Service: General;  Laterality: N/A;   Social History   Social History  . Marital Status: married    Spouse Name: N/A  . Number of Children: 3   Occupational History  . n/a   Social History Main Topics  . Smoking status: Never Smoker   . Smokeless tobacco: Not on file  . Alcohol Use: 0.0 oz/week    0 Standard drinks or equivalent per week     Comment: social - Tequila  . Drug Use: No  . Sexual Activity: Yes     Comment: IUD- MIRENA   Social History Narrative   Age of menarche: 26 yrs   Gravida: 4 pregnancies                  3 children      Current Outpatient Medications on File Prior to Visit  Medication Sig Dispense Refill  . baclofen (LIORESAL) 10 MG tablet Take 1 tablet (10 mg total) by mouth 3 (three) times daily. 30  each 0  . levonorgestrel (MIRENA) 20 MCG/24HR IUD 1 each by Intrauterine route once.    Marland Kitchen SYNTHROID 50 MCG tablet Take 50 mcg by mouth every other day. 45 tablet 3  . SYNTHROID 75 MCG tablet Take 75 mcg by mouth every other day 45 tablet 3   No current facility-administered medications on file prior to visit.    No Known Allergies Family History  Problem Relation Age of Onset  . Hypertension Mother    PE: BP 100/70   Pulse 62   Ht 4' 11.5" (1.511 m) Comment: measured today without shoes  Wt 110 lb (49.9 kg)   SpO2 97%   BMI 21.85 kg/m  Wt Readings from Last 3 Encounters:  08/28/19 110 lb (49.9 kg)  06/25/19 107 lb (48.5 kg)  08/31/18 110 lb (49.9 kg)   Constitutional: Normal weight, in NAD Eyes: PERRLA, EOMI, no exophthalmos ENT: moist mucous membranes, no neck masses palpated, thyroidectomy scar healed, no cervical lymphadenopathy Cardiovascular: RRR, No MRG Respiratory: CTA B Gastrointestinal: abdomen soft, NT, ND, BS+ Musculoskeletal: no deformities, strength intact in all 4 Skin:  moist, warm, no rashes Neurological: no tremor with outstretched hands, DTR normal in all 4  ASSESSMENT: 1. Thyroid cancer - see HPI  2. Postsurgical Hypothyroidism  3.  Postsurgical hypocalcemia  PLAN:  1. Thyroid cancer - papillary -Reviewed her thyroid cancer history -again discussed that this has very good prognosis and it is unlikely to reduce her quality of life or life expectancy. -Her surgical scar is completely healed, however, she called [place] in 06/2019 feeling "a heavy neck" -I advised her that this is unlikely to be related to her thyroid since she had total thyroidectomy.  Discussed to contact PCP. -Her tumor was larger than 1.5 cm so we proceeded with RAI treatment for postop thyroid remnant ablation.  We discussed at that time that the main role of this is to facilitate monitoring in the long run, by checking thyroglobulin.  She had RAI with Thyrogen in 06/2016.  Posttreatment  whole-body scan was negative for metastasis or extension in neck.  We obtained a neck ultrasound at the end of 2018 and this was negative for any abnormal masses. -Latest thyroglobulin level remained detectable, but at the lower limit of detection.  Antithyroglobulin antibodies were negative. -We will  repeat these in 1.5 months  2. Patient with history of total thyroidectomy for thyroid cancer, now with iatrogenic hypothyroidism - latest thyroid labs reviewed with Molly Prince >> normal a week ago: Lab Results  Component Value Date   TSH 0.78 08/22/2019  - she continues on Synthroid d.a.w. 75 alternating with 50 mcg every other day - Molly Prince feels well on this dose, with results fatigue and also no more headaches - we discussed about taking the thyroid hormone every day, with water, >30 minutes before breakfast, separated by >4 hours from acid reflux medications, calcium, iron, multivitamins. Molly Prince. is taking it correctly. -We will repeat her TFTs in 1.5 months.  3.  Postsurgical hypocalcemia -Patient had low calcium levels after the surgery and they remained in the low normal range, however, at last check in 06/2019 her calcium was normal at 9.0.  At that time, her vitamin D level was normal, at 32. -She denies perioral numbness or acral cramping -We will continue off calcium (she was taking 600 mg daily after surgery).  Component     Latest Ref Rng & Units 10/16/2019  Thyroglobulin     ng/mL 0.1 (L)  Comment        TSH     0.35 - 4.50 uIU/mL 3.59  T4,Free(Direct)     0.60 - 1.60 ng/dL 1.07  Thyroglobulin Ab     < or = 1 IU/mL <1   Stable thyroglobulin.  TSH is now higher, but normal.  This is higher than our target, but I would like to check another TSH in 2 months before increasing the dose, due to previous suppressed TSH on the 75 mcg Synthroid daily.  Philemon Kingdom, MD PhD [facility] Endocr[facility]

## 2019-08-28 NOTE — Patient Instructions (Signed)
Please come back for labs in 1.5 months.  Please continue Synthroid 50 alternating with 75 mcg every other day.  Take the thyroid hormone every day, with water, at least 30 minutes before breakfast, separated by at least 4 hours from: - acid reflux medications - calcium - iron - multivitamins  Please come back for a follow-up appointment in 1 year.

## 2019-10-02 ENCOUNTER — Encounter: Payer: Self-pay | Admitting: Internal Medicine

## 2019-10-02 ENCOUNTER — Ambulatory Visit (INDEPENDENT_AMBULATORY_CARE_PROVIDER_SITE_OTHER): Payer: BC Managed Care – PPO | Admitting: Internal Medicine

## 2019-10-02 ENCOUNTER — Other Ambulatory Visit: Payer: Self-pay

## 2019-10-02 VITALS — BP 102/60 | HR 66 | Temp 98.3°F | Wt 108.1 lb

## 2019-10-02 DIAGNOSIS — M542 Cervicalgia: Secondary | ICD-10-CM

## 2019-10-02 DIAGNOSIS — E890 Postprocedural hypothyroidism: Secondary | ICD-10-CM

## 2019-10-02 DIAGNOSIS — Z23 Encounter for immunization: Secondary | ICD-10-CM

## 2019-10-02 DIAGNOSIS — Z87410 Personal history of cervical dysplasia: Secondary | ICD-10-CM | POA: Diagnosis not present

## 2019-10-02 DIAGNOSIS — C73 Malignant neoplasm of thyroid gland: Secondary | ICD-10-CM

## 2019-10-02 NOTE — Patient Instructions (Signed)
-  Nice seeing you today!!  -For your neck and shoulders: Icing 15 minutes twice a day, ibuprofen 2 tablets as needed for pain, Massage therapy, correct posture.  -Tetanus vaccine today.  -Schedule follow up in 6 months for your physical. Please come in fasting that day.

## 2019-10-02 NOTE — Progress Notes (Signed)
Established Patient Office Visit     This visit occurred during the SARS-CoV-2 public health emergency.  Safety protocols were in place, including screening questions prior to the visit, additional usage of staff PPE, and extensive cleaning of exam room while observing appropriate contact time as indicated for disinfecting solutions.    CC/Reason for Visit: Establish care, discuss chronic medical conditions, discuss neck upper back pain  HPI: Molly Prince is a 48 y.o. female who is coming in today for the above mentioned reasons. Past Medical History is significant for: Papillary carcinoma of the thyroid diagnosed in 2017 followed by Dr. Cruzita Lederer.  She had a complete thyroidectomy followed by radioactive iodine and is now alternating 75 mcg of Synthroid with 50 mcg every other day.  Other than that she does not have any medical history.  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  Family history is only significant for mother with hypertension.  Since June she has been having on and off neck and shoulder and upper back pain.  She does not do a lot of computer work but she does use her phone frequently and is hunched over it.  She had a visit with Dr. Martinique a while back for this.  She was asked to try conservative measures and was prescribed baclofen.  Pain improved but now has come back again.  She has no radiculopathy, no numbness, no tingling.   Past Medical/Surgical History: Past Medical History:  Diagnosis Date  . Cancer [facility])    papillary thyroid carcinoma     Past Surgical History:  Procedure Laterality Date  . THYROIDECTOMY N/A 01/08/2016   Procedure: TOTAL THYROIDECTOMY;  Surgeon: Armandina Gemma, MD;  Location: [place];  Service: General;  Laterality: N/A;    Social History:  reports that she has never smoked. She has never used smokeless tobacco. She  reports previous alcohol use. She reports that she does not use drugs.  Allergies: No Known Allergies  Family History:  Family History  Problem Relation Age of Onset  . Hypertension Mother      Current Outpatient Medications:  .  levonorgestrel (MIRENA) 20 MCG/24HR IUD, 1 each by Intrauterine route once., Disp: , Rfl:  .  SYNTHROID 50 MCG tablet, Take 50 mcg by mouth every other day., Disp: 45 tablet, Rfl: 3 .  SYNTHROID 75 MCG tablet, Take 75 mcg by mouth every other day, Disp: 45 tablet, Rfl: 3 .  baclofen (LIORESAL) 10 MG tablet, Take 1 tablet (10 mg total) by mouth 3 (three) times daily. (Patient not taking: Reported on 10/02/2019), Disp: 30 each, Rfl: 0  Review of Systems:  Constitutional: Denies fever, chills, diaphoresis, appetite change and fatigue.  HEENT: Denies photophobia, eye pain, redness, hearing loss, ear pain, congestion, sore throat, rhinorrhea, sneezing, mouth sores, trouble swallowing, neck pain, neck stiffness and tinnitus.   Respiratory: Denies SOB, DOE, cough, chest tightness,  and wheezing.   Cardiovascular: Denies chest pain, palpitations and leg swelling.  Gastrointestinal: Denies nausea, vomiting, abdominal pain, diarrhea, constipation, blood in stool and abdominal distention.  Genitourinary: Denies dysuria, urgency, frequency, hematuria, flank pain and difficulty urinating.  Endocrine: Denies: hot or cold intolerance, sweats, changes in hair or nails, polyuria, polydipsia. Musculoskeletal: Denies  joint swelling, arthralgias and gait problem.  Skin: Denies pallor, rash and wound.  Neurological: Denies dizziness, seizures, syncope, weakness, light-headedness, numbness and headaches.  Hematological: Denies adenopathy.  Easy bruising, personal or family bleeding history  Psychiatric/Behavioral: Denies suicidal ideation, mood changes, confusion, nervousness, sleep disturbance and agitation    Physical Exam: Vitals:   10/02/19 0941  BP: 102/60  Pulse: 66    Temp: 98.3 F (36.8 C)  TempSrc: Temporal  SpO2: 100%  Weight: 108 lb 1.6 oz (49 kg)    Body mass index is 21.47 kg/m.   Constitutional: NAD, calm, comfortable Eyes: PERRL, lids and conjunctivae normal ENMT: Mucous membranes are moist.  Neck: normal, supple, no masses, no thyromegaly, prior anterior nucal scar from thyroidectomy. Respiratory: clear to auscultation bilaterally, no wheezing, no crackles. Normal respiratory effort. No accessory muscle use.  Cardiovascular: Regular rate and rhythm, no murmurs / rubs / gallops. No extremity edema. 2+ pedal pulses. No carotid bruits.  Abdomen: no tenderness, no masses palpated. No hepatosplenomegaly. Bowel sounds positive.  Musculoskeletal: no clubbing / cyanosis. No joint deformity upper and lower extremities. Good ROM, no contractures. Normal muscle tone.  Skin: no rashes, lesions, ulcers. No induration Neurologic: Grossly intact and nonfocal Psychiatric: Normal judgment and insight. Alert and oriented x 3. Normal mood.    Impression and Plan:  Neck pain -Etiology is likely musculoskeletal, tension, probably precipitated by bad posture while using phone. -Advised better posture, supportive footwear, icing, as needed ibuprofen and massage therapy. -She will contact us if she continues to have issues.  Papillary thyroid carcinoma (HCC) Postsurgical hypothyroidism -Alternating 50 mcg with 75 mcg of Synthroid. -Followed by endocrinology, Dr. Cruzita Lederer.  History of cervical dysplasia -Follows with GYN.    Patient Instructions  -Nice seeing you today!!  -For your neck and shoulders: Icing 15 minutes twice a day, ibuprofen 2 tablets as needed for pain, Massage therapy, correct posture.  -Tetanus vaccine today.  -Schedule follow up in 6 months for your physical. Please come in fasting that day.     Lelon Frohlich, MD [facility]

## 2019-10-02 NOTE — Addendum Note (Signed)
Addended by: Alverda Skeans on: 10/02/2019 10:48 AM   Modules accepted: Orders

## 2019-10-16 ENCOUNTER — Other Ambulatory Visit: Payer: Self-pay

## 2019-10-16 ENCOUNTER — Other Ambulatory Visit: Payer: Self-pay | Admitting: Internal Medicine

## 2019-10-16 ENCOUNTER — Other Ambulatory Visit (INDEPENDENT_AMBULATORY_CARE_PROVIDER_SITE_OTHER): Payer: BC Managed Care – PPO

## 2019-10-16 DIAGNOSIS — E890 Postprocedural hypothyroidism: Secondary | ICD-10-CM | POA: Diagnosis not present

## 2019-10-16 DIAGNOSIS — C73 Malignant neoplasm of thyroid gland: Secondary | ICD-10-CM

## 2019-10-16 LAB — TSH: TSH: 3.59 u[IU]/mL (ref 0.35–4.50)

## 2019-10-16 LAB — T4, FREE: Free T4: 1.07 ng/dL (ref 0.60–1.60)

## 2019-10-17 LAB — THYROGLOBULIN LEVEL: Thyroglobulin: 0.1 ng/mL — ABNORMAL LOW

## 2019-10-17 LAB — THYROGLOBULIN ANTIBODY: Thyroglobulin Ab: <1 IU/mL (ref <=1)

## 2019-10-18 NOTE — Addendum Note (Signed)
Addended by: Philemon Kingdom on: 10/18/2019 11:39 AM   Modules accepted: Orders

## 2019-10-24 ENCOUNTER — Telehealth: Payer: Self-pay | Admitting: Internal Medicine

## 2019-10-24 NOTE — Telephone Encounter (Signed)
I don't believe we had discussed Rx meds for her back pain. If she is still having issues, could we set up a VV?  [place]

## 2019-10-24 NOTE — Telephone Encounter (Signed)
Message Routed to PCP CMA 

## 2019-10-24 NOTE — Telephone Encounter (Signed)
Pt was seen on 11.25.20 and stated she has contacted pharmacy for her Rx but pharmacy states they have not received an Rx for muscle tension/ Pt doesn't know what Med Dr. Jerilee Hoh advised she would prescribe/ please advise

## 2019-10-24 NOTE — Telephone Encounter (Signed)
Left message on machine for patient returning her call. CRM

## 2019-10-29 ENCOUNTER — Encounter: Payer: Self-pay | Admitting: Family Medicine

## 2019-10-29 ENCOUNTER — Other Ambulatory Visit: Payer: Self-pay

## 2019-10-29 ENCOUNTER — Telehealth: Payer: BC Managed Care – PPO | Admitting: Family Medicine

## 2019-10-29 NOTE — Telephone Encounter (Signed)
Spoke with patient and she will try ice and ibuprofen and call back if needed.

## 2019-10-31 NOTE — Progress Notes (Signed)
Error. Unsure why this was in my inbasket as appt was cancelled it says.

## 2020-01-09 ENCOUNTER — Telehealth: Payer: Self-pay | Admitting: Internal Medicine

## 2020-01-09 MED ORDER — SYNTHROID 75 MCG PO TABS: ORAL_TABLET | ORAL | 3 refills | Status: DC

## 2020-01-09 MED ORDER — SYNTHROID 50 MCG PO TABS: ORAL_TABLET | ORAL | 3 refills | Status: DC

## 2020-01-09 NOTE — Telephone Encounter (Signed)
RX sent

## 2020-01-09 NOTE — Telephone Encounter (Signed)
MEDICATION: Levothryoxine  PHARMACY:  [facility] in [place] [address]  IS THIS A 90 DAY SUPPLY :   IS PATIENT OUT OF MEDICATION:   IF NOT; HOW MUCH IS LEFT:   LAST APPOINTMENT DATE: @10 /21/2020  NEXT APPOINTMENT DATE:@3 /08/2020  DO WE HAVE YOUR PERMISSION TO LEAVE A DETAILED MESSAGE: yes  OTHER COMMENTS:    **Let patient know to contact pharmacy at the end of the day to make sure medication is ready. **  ** Please notify patient to allow 48-72 hours to process**  **Encourage patient to contact the pharmacy for refills or they can request refills through [facility]**

## 2020-01-15 ENCOUNTER — Other Ambulatory Visit: Payer: Self-pay

## 2020-01-15 ENCOUNTER — Other Ambulatory Visit (INDEPENDENT_AMBULATORY_CARE_PROVIDER_SITE_OTHER): Payer: BC Managed Care – PPO

## 2020-01-15 DIAGNOSIS — E890 Postprocedural hypothyroidism: Secondary | ICD-10-CM

## 2020-01-15 LAB — TSH: TSH: 4.15 u[IU]/mL (ref 0.35–4.50)

## 2020-01-15 LAB — T4, FREE: Free T4: 0.93 ng/dL (ref 0.60–1.60)

## 2020-01-16 ENCOUNTER — Telehealth: Payer: Self-pay | Admitting: Internal Medicine

## 2020-01-16 NOTE — Telephone Encounter (Signed)
Patient called and has questions about her lab results and her medication dosages.  Please contact her at [phone]

## 2020-01-24 ENCOUNTER — Telehealth: Payer: Self-pay | Admitting: Internal Medicine

## 2020-01-24 MED ORDER — LEVOTHYROXINE SODIUM 75 MCG PO TABS: ORAL_TABLET | ORAL | 3 refills | Status: DC

## 2020-01-24 NOTE — Telephone Encounter (Signed)
RX sent

## 2020-01-24 NOTE — Telephone Encounter (Signed)
Pharmacy called stating patient is already on the generic RX for synthroid 50 mcg - they are requesting an RX for the generic for 75 mcg.     [facility] [place], [place] - [address] [place] [address] OF [place] [address] Phone:  [phone]  Fax:  [phone]

## 2020-04-09 ENCOUNTER — Ambulatory Visit: Payer: BC Managed Care – PPO | Admitting: Nurse Practitioner

## 2020-04-09 ENCOUNTER — Encounter: Payer: Self-pay | Admitting: Nurse Practitioner

## 2020-04-09 ENCOUNTER — Other Ambulatory Visit: Payer: Self-pay

## 2020-04-09 VITALS — BP 116/70

## 2020-04-09 DIAGNOSIS — N898 Other specified noninflammatory disorders of vagina: Secondary | ICD-10-CM | POA: Diagnosis not present

## 2020-04-09 LAB — WET PREP FOR TRICH, YEAST, CLUE

## 2020-04-09 NOTE — Progress Notes (Signed)
   Acute Office Visit  Subjective:    Patient ID: Molly Prince, female    DOB: [birth year], 49 y.o.   MRN: [id number]  Chief Complaint  Patient presents with  . Vaginal odor    HPI 49 y.o. spanish speaking female presents today for vaginal odor that began 5 days ago. Denies vagina itching or discharge, denies urinary symptoms. No new sexual partners. Says this occurs every couple of months for about a week at a time and resolves on its own. Interpreter present during entire encounter.   Review of Systems  Gastrointestinal: Negative.   Genitourinary: Negative for dysuria, frequency, pelvic pain, urgency and vaginal discharge.       Objective:    Physical Exam Constitutional:      Appearance: Normal appearance.  Abdominal:     General: Abdomen is flat.     Palpations: Abdomen is soft.  Genitourinary:    General: Normal vulva.     Vagina: Vaginal discharge (white, creamy discharge) present. No erythema or tenderness.     Cervix: No cervical motion tenderness, discharge or erythema.     BP 116/70  Wt Readings from Last 3 Encounters:  10/02/19 108 lb 1.6 oz (49 kg)  08/28/19 110 lb (49.9 kg)  06/25/19 107 lb (48.5 kg)   Wet prep negative     Assessment & Plan:   Problem List Items Addressed This Visit    None    Visit Diagnoses    Vaginal odor    -  Primary   Relevant Orders   WET PREP FOR [place], YEAST, CLUE     Plan: Wet prep unremarkable.  Discussed good vaginal hygiene practices, not using soaps with fragrance, and use of cotton underwear. All questions answered.       Tamela Gammon [facility], 2:10 PM 04/09/2020

## 2020-06-04 ENCOUNTER — Other Ambulatory Visit: Payer: Self-pay | Admitting: Obstetrics & Gynecology

## 2020-06-04 DIAGNOSIS — Z1231 Encounter for screening mammogram for malignant neoplasm of breast: Secondary | ICD-10-CM

## 2020-06-12 ENCOUNTER — Ambulatory Visit
Admission: RE | Admit: 2020-06-12 | Discharge: 2020-06-12 | Disposition: A | Discharge status: Home / Self Care | Payer: BC Managed Care – PPO | Source: Ambulatory Visit | Attending: Obstetrics & Gynecology | Admitting: Obstetrics & Gynecology

## 2020-06-12 ENCOUNTER — Other Ambulatory Visit: Payer: Self-pay

## 2020-06-12 DIAGNOSIS — Z1231 Encounter for screening mammogram for malignant neoplasm of breast: Secondary | ICD-10-CM | POA: Diagnosis not present

## 2020-06-16 ENCOUNTER — Other Ambulatory Visit: Payer: Self-pay | Admitting: Obstetrics & Gynecology

## 2020-06-16 ENCOUNTER — Ambulatory Visit: Payer: BC Managed Care – PPO

## 2020-06-16 DIAGNOSIS — R928 Other abnormal and inconclusive findings on diagnostic imaging of breast: Secondary | ICD-10-CM

## 2020-06-23 ENCOUNTER — Ambulatory Visit: Payer: BC Managed Care – PPO

## 2020-06-25 ENCOUNTER — Ambulatory Visit: Payer: BC Managed Care – PPO | Admitting: Obstetrics & Gynecology

## 2020-06-25 ENCOUNTER — Other Ambulatory Visit: Payer: Self-pay

## 2020-06-25 ENCOUNTER — Encounter: Payer: Self-pay | Admitting: Obstetrics & Gynecology

## 2020-06-25 VITALS — BP 106/70 | Ht 59.75 in | Wt 103.0 lb

## 2020-06-25 DIAGNOSIS — Z30431 Encounter for routine checking of intrauterine contraceptive device: Secondary | ICD-10-CM

## 2020-06-25 DIAGNOSIS — N6310 Unspecified lump in the right breast, unspecified quadrant: Secondary | ICD-10-CM | POA: Diagnosis not present

## 2020-06-25 DIAGNOSIS — Z1321 Encounter for screening for nutritional disorder: Secondary | ICD-10-CM | POA: Diagnosis not present

## 2020-06-25 DIAGNOSIS — E785 Hyperlipidemia, unspecified: Secondary | ICD-10-CM | POA: Diagnosis not present

## 2020-06-25 DIAGNOSIS — Z01419 Encounter for gynecological examination (general) (routine) without abnormal findings: Secondary | ICD-10-CM

## 2020-06-25 DIAGNOSIS — Z85850 Personal history of malignant neoplasm of thyroid: Secondary | ICD-10-CM

## 2020-06-25 DIAGNOSIS — Z1329 Encounter for screening for other suspected endocrine disorder: Secondary | ICD-10-CM | POA: Diagnosis not present

## 2020-06-25 DIAGNOSIS — C73 Malignant neoplasm of thyroid gland: Secondary | ICD-10-CM

## 2020-06-25 DIAGNOSIS — N6320 Unspecified lump in the left breast, unspecified quadrant: Secondary | ICD-10-CM

## 2020-06-25 LAB — LIPID PANEL
Cholesterol: 189 mg/dL (ref <200)
HDL: 56 mg/dL (ref >=50)
LDL Cholesterol (Calc): 118 mg/dL (calc) — ABNORMAL HIGH
Non-HDL Cholesterol (Calc): 133 mg/dL (calc) — ABNORMAL HIGH (ref <130)
Total CHOL/HDL Ratio: 3.4 (calc) (ref <5.0)
Triglycerides: 63 mg/dL (ref <150)

## 2020-06-25 LAB — COMPREHENSIVE METABOLIC PANEL
AG Ratio: 1.6 (calc) (ref 1.0–2.5)
ALT: 22 U/L (ref 6–29)
AST: 22 U/L (ref 10–35)
Albumin: 4.2 g/dL (ref 3.6–5.1)
Alkaline phosphatase (APISO): 53 U/L (ref 37–153)
BUN: 15 mg/dL (ref 7–25)
CO2: 26 mmol/L (ref 20–32)
Calcium: 8.5 mg/dL — ABNORMAL LOW (ref 8.6–10.4)
Chloride: 103 mmol/L (ref 98–110)
Creat: 0.73 mg/dL (ref 0.50–1.05)
Globulin: 2.7 g/dL (calc) (ref 1.9–3.7)
Glucose, Bld: 81 mg/dL (ref 65–99)
Potassium: 4.1 mmol/L (ref 3.5–5.3)
Sodium: 138 mmol/L (ref 135–146)
Total Bilirubin: 0.6 mg/dL (ref 0.2–1.2)
Total Protein: 6.9 g/dL (ref 6.1–8.1)

## 2020-06-25 LAB — CBC
HCT: 39.2 % (ref 35.0–45.0)
Hemoglobin: 13.1 g/dL (ref 11.7–15.5)
MCH: 30.4 pg (ref 27.0–33.0)
MCHC: 33.4 g/dL (ref 32.0–36.0)
MCV: 91 fL (ref 80.0–100.0)
MPV: 12.3 fL (ref 7.5–12.5)
Platelets: 184 10*3/uL (ref 140–400)
RBC: 4.31 10*6/uL (ref 3.80–5.10)
RDW: 13.1 % (ref 11.0–15.0)
WBC: 7 10*3/uL (ref 3.8–10.8)

## 2020-06-25 LAB — VITAMIN D 25 HYDROXY (VIT D DEFICIENCY, FRACTURES): Vit D, 25-Hydroxy: 19 ng/mL — ABNORMAL LOW (ref 30–100)

## 2020-06-25 LAB — TSH: TSH: 3.18 mIU/L

## 2020-06-25 NOTE — Progress Notes (Signed)
Molly Prince 03-30-1971 [id number]   History:    49 y.o.  [id number] Married.  Children: 45 yo daughter in [place], 68 yo and 50 yo  RP:  Established patient presenting for annual gyn exam   HPI: Well on Mirena IUD x 09/2015.  No menses, no BTB.  No pelvic pain.  No pain with IC.  Urine/BMs normal.Breasts wnl, no pain and no lumps currently.  Rx Dx mammo benign 04/2019.BMI 20.28. Physically active, walking and swimming/healthy nutrition. Fasting health labs here today.History of thyroidectomy for thyroid cancer on Synthroid.  Past medical history,surgical history, family history and social history were all reviewed and documented in the EPIC chart.  Gynecologic History No LMP recorded. (Menstrual status: IUD).  Obstetric History OB History  Gravida Para Term Preterm AB Living  5 3     2 3   SAB TAB Ectopic Multiple Live Births               # Outcome Date GA Lbr Len/2nd Weight Sex Delivery Anes PTL Lv  5 AB           4 AB           3 Para           2 Para           1 Para              ROS: A ROS was performed and pertinent positives and negatives are included in the history.  GENERAL: No fevers or chills. HEENT: No change in vision, no earache, sore throat or sinus congestion. NECK: No pain or stiffness. CARDIOVASCULAR: No chest pain or pressure. No palpitations. PULMONARY: No shortness of breath, cough or wheeze. GASTROINTESTINAL: No abdominal pain, nausea, vomiting or diarrhea, melena or bright red blood per rectum. GENITOURINARY: No urinary frequency, urgency, hesitancy or dysuria. MUSCULOSKELETAL: No joint or muscle pain, no back pain, no recent trauma. DERMATOLOGIC: No rash, no itching, no lesions. ENDOCRINE: No polyuria, polydipsia, no heat or cold intolerance. No recent change in weight. HEMATOLOGICAL: No anemia or easy bruising or bleeding. NEUROLOGIC: No headache, seizures, numbness, tingling or weakness. PSYCHIATRIC: No depression, no loss of interest in normal  activity or change in sleep pattern.     Exam:   BP 106/70   Ht 4' 11.75" (1.518 m)   Wt 103 lb (46.7 kg)   BMI 20.28 kg/m   Body mass index is 20.28 kg/m.  General appearance : Well developed well nourished female. No acute distress HEENT: Eyes: no retinal hemorrhage or exudates,  Neck supple, trachea midline, no carotid bruits, no thyroidmegaly Lungs: Clear to auscultation, no rhonchi or wheezes, or rib retractions  Heart: Regular rate and rhythm, no murmurs or gallops Breast:Examined in sitting and supine position were symmetrical in appearance, no palpable masses or tenderness,  no skin retraction, no nipple inversion, no nipple discharge, no skin discoloration, no axillary or supraclavicular lymphadenopathy Abdomen: no palpable masses or tenderness, no rebound or guarding Extremities: no edema or skin discoloration or tenderness  Pelvic: Vulva: Normal             Vagina: No gross lesions or discharge  Cervix: No gross lesions or discharge.  IUD strings visible at [facility].  Uterus  AV, normal size, shape and consistency, non-tender and mobile  Adnexa  Without masses or tenderness  Anus: Normal   Assessment/Plan:  49 y.o. female for annual exam   1. Well female exam with routine gynecological exam  Normal gynecologic exam.  Pap test negative August 2020, no indication to repeat this year.  Body mass index 20.28.  Continue with fitness and healthy nutrition.  Fasting health labs here today. - CBC - Comp Met (CMET) - TSH - Lipid panel - VITAMIN D 25 Hydroxy (Vit-D Deficiency, Fractures)  2. Encounter for routine checking of intrauterine contraceptive device (IUD) Mirena IUD since November 2016.  Doing well on it.  IUD in good position.  We will follow-up November 2021 to remove IUD and insert a new Mirena IUD.  3. Masses of both breasts Breast exam normal.  Bilateral possible mass of breast on screening mammogram.  Bilateral diagnostic mammogram and ultrasound scheduled for  next Monday, August 23.  4. Papillary thyroid carcinoma (HCC)  [name] MD, 10:43 AM 06/25/2020

## 2020-06-26 ENCOUNTER — Other Ambulatory Visit: Payer: Self-pay | Admitting: Internal Medicine

## 2020-06-26 ENCOUNTER — Ambulatory Visit: Payer: BC Managed Care – PPO | Admitting: Obstetrics & Gynecology

## 2020-06-26 MED ORDER — LEVOTHYROXINE SODIUM 75 MCG PO TABS
ORAL_TABLET | ORAL | 3 refills | Status: DC
Start: 2020-06-26 — End: 2020-07-16

## 2020-06-29 ENCOUNTER — Ambulatory Visit
Admission: RE | Admit: 2020-06-29 | Discharge: 2020-06-29 | Disposition: A | Discharge status: Home / Self Care | Payer: BC Managed Care – PPO | Source: Ambulatory Visit | Attending: Obstetrics & Gynecology | Admitting: Obstetrics & Gynecology

## 2020-06-29 ENCOUNTER — Other Ambulatory Visit: Payer: Self-pay

## 2020-06-29 DIAGNOSIS — R928 Other abnormal and inconclusive findings on diagnostic imaging of breast: Secondary | ICD-10-CM

## 2020-06-29 DIAGNOSIS — R922 Inconclusive mammogram: Secondary | ICD-10-CM | POA: Diagnosis not present

## 2020-06-29 DIAGNOSIS — N6012 Diffuse cystic mastopathy of left breast: Secondary | ICD-10-CM | POA: Diagnosis not present

## 2020-06-29 DIAGNOSIS — N6011 Diffuse cystic mastopathy of right breast: Secondary | ICD-10-CM | POA: Diagnosis not present

## 2020-07-01 ENCOUNTER — Other Ambulatory Visit: Payer: BC Managed Care – PPO

## 2020-07-01 ENCOUNTER — Other Ambulatory Visit: Payer: Self-pay | Admitting: Anesthesiology

## 2020-07-01 MED ORDER — VITAMIN D (ERGOCALCIFEROL) 1.25 MG (50000 UNIT) PO CAPS: 50000.0000 [IU] | ORAL_CAPSULE | ORAL | 0 refills | Status: DC

## 2020-07-11 ENCOUNTER — Other Ambulatory Visit: Payer: Self-pay | Admitting: Internal Medicine

## 2020-07-15 ENCOUNTER — Telehealth: Payer: Self-pay | Admitting: Internal Medicine

## 2020-07-15 NOTE — Telephone Encounter (Signed)
Please advise.  Lab result from last month states to take 75 mcg.

## 2020-07-15 NOTE — Telephone Encounter (Signed)
Medication Refill Request  Did you call your pharmacy and request this refill first? Yes  . If patient has not contacted pharmacy first, instruct them to do so for future refills.  . Remind them that contacting the pharmacy for their refill is the quickest method to get the refill.  . Refill policy also stated that it will take anywhere between 24-72 hours to receive the refill.    Name of medication? Levothyroxine 50 mcg  Is this a 90 day supply? yes  Name and location of pharmacy?  [facility] [place], [place] - [address] Phone:  [phone]  Fax:  [phone]

## 2020-07-16 MED ORDER — LEVOTHYROXINE SODIUM 75 MCG PO TABS: ORAL_TABLET | ORAL | 3 refills | Status: DC

## 2020-07-16 NOTE — Addendum Note (Signed)
Addended by: Cardell Peach I on: 07/16/2020 08:22 AM   Modules accepted: Orders

## 2020-07-16 NOTE — Telephone Encounter (Signed)
RX sent

## 2020-07-16 NOTE — Telephone Encounter (Signed)
Yes, we increase the dose to 75 mcg daily.  We can send this dose instead.

## 2020-07-20 ENCOUNTER — Other Ambulatory Visit: Payer: Self-pay

## 2020-07-21 ENCOUNTER — Ambulatory Visit (INDEPENDENT_AMBULATORY_CARE_PROVIDER_SITE_OTHER): Payer: BC Managed Care – PPO | Admitting: Internal Medicine

## 2020-07-21 ENCOUNTER — Encounter: Payer: Self-pay | Admitting: Internal Medicine

## 2020-07-21 VITALS — BP 98/64 | Temp 98.2°F | Wt 107.5 lb

## 2020-07-21 DIAGNOSIS — R42 Dizziness and giddiness: Secondary | ICD-10-CM

## 2020-07-21 DIAGNOSIS — Z23 Encounter for immunization: Secondary | ICD-10-CM | POA: Diagnosis not present

## 2020-07-21 NOTE — Addendum Note (Signed)
Addended by: Westley Hummer B on: 07/21/2020 03:20 PM   Modules accepted: Orders

## 2020-07-21 NOTE — Patient Instructions (Signed)
-  Vacunas contra flu y shingles hoy.

## 2020-07-21 NOTE — Progress Notes (Signed)
Established Patient Office Visit     This visit occurred during the SARS-CoV-2 public health emergency.  Safety protocols were in place, including screening questions prior to the visit, additional usage of staff PPE, and extensive cleaning of exam room while observing appropriate contact time as indicated for disinfecting solutions.    CC/Reason for Visit: Dizziness  HPI: Molly Prince is a 49 y.o. female who is coming in today for the above mentioned reasons. Past Medical History is significant for: Papillary thyroid cancer status post thyroidectomy now on levothyroxine followed by endocrinology. She is requesting flu and shingles vaccines today. Last week for 2 days she had some mild dizziness in the morning that resolved throughout the day. She has been feeling well since with no recurrence. She did not have palpitations, chest pains, no true vertigo. She does not feel like she did any excess work, did not feel dehydrated.   Past Medical/Surgical History: Past Medical History:  Diagnosis Date  . Cancer [facility])    papillary thyroid carcinoma     Past Surgical History:  Procedure Laterality Date  . THYROIDECTOMY N/A 01/08/2016   Procedure: TOTAL THYROIDECTOMY;  Surgeon: Armandina Gemma, MD;  Location: [place];  Service: General;  Laterality: N/A;    Social History:  reports that she has never smoked. She has never used smokeless tobacco. She reports previous alcohol use. She reports that she does not use drugs.  Allergies: No Known Allergies  Family History:  Family History  Problem Relation Age of Onset  . Hypertension Mother      Current Outpatient Medications:  .  levonorgestrel (MIRENA) 20 MCG/24HR IUD, 1 each by Intrauterine route once., Disp: , Rfl:  .  levothyroxine (SYNTHROID) 75 MCG tablet, Take 75 mcg by mouth every day, Disp: 90 tablet, Rfl: 3 .  Vitamin D, Ergocalciferol, (DRISDOL) 1.25 MG (50000 UNIT) CAPS capsule, Take 1 capsule (50,000 Units total) by  mouth every 7 (seven) days., Disp: 12 capsule, Rfl: 0  Review of Systems:  Constitutional: Denies fever, chills, diaphoresis, appetite change and fatigue.  HEENT: Denies photophobia, eye pain, redness, hearing loss, ear pain, congestion, sore throat, rhinorrhea, sneezing, mouth sores, trouble swallowing, neck pain, neck stiffness and tinnitus.   Respiratory: Denies SOB, DOE, cough, chest tightness,  and wheezing.   Cardiovascular: Denies chest pain, palpitations and leg swelling.  Gastrointestinal: Denies nausea, vomiting, abdominal pain, diarrhea, constipation, blood in stool and abdominal distention.  Genitourinary: Denies dysuria, urgency, frequency, hematuria, flank pain and difficulty urinating.  Endocrine: Denies: hot or cold intolerance, sweats, changes in hair or nails, polyuria, polydipsia. Musculoskeletal: Denies myalgias, back pain, joint swelling, arthralgias and gait problem.  Skin: Denies pallor, rash and wound.  Neurological: Denies dizziness, seizures, syncope, weakness, light-headedness, numbness and headaches.  Hematological: Denies adenopathy. Easy bruising, personal or family bleeding history  Psychiatric/Behavioral: Denies suicidal ideation, mood changes, confusion, nervousness, sleep disturbance and agitation    Physical Exam: Vitals:   07/21/20 0931  BP: 98/64  Temp: 98.2 F (36.8 C)  TempSrc: Oral  Weight: 107 lb 8 oz (48.8 kg)    Body mass index is 21.17 kg/m.   Constitutional: NAD, calm, comfortable Eyes: PERRL, lids and conjunctivae normal ENMT: Mucous membranes are moist. Respiratory: clear to auscultation bilaterally, no wheezing, no crackles. Normal respiratory effort. No accessory muscle use.  Cardiovascular: Regular rate and rhythm, no murmurs / rubs / gallops. No extremity edema.  Neurologic: Grossly intact and nonfocal Psychiatric: Normal judgment and insight. Alert and oriented  x 3. Normal mood.    Impression and  Plan:  Dizziness -Self-limited and resolved. -Etiology unclear, have advised observation for now.  Need for influenza vaccination -Influenza vaccine administered today.  Need for shingles vaccine -For shingles vaccine administered today.    Patient Instructions  -Vacunas contra flu y shingles hoy.     Lelon Frohlich, MD [facility]

## 2020-07-29 ENCOUNTER — Ambulatory Visit: Payer: BC Managed Care – PPO | Admitting: Nurse Practitioner

## 2020-07-29 ENCOUNTER — Other Ambulatory Visit: Payer: Self-pay

## 2020-07-29 ENCOUNTER — Encounter: Payer: Self-pay | Admitting: Nurse Practitioner

## 2020-07-29 DIAGNOSIS — R8290 Unspecified abnormal findings in urine: Secondary | ICD-10-CM

## 2020-07-29 DIAGNOSIS — N898 Other specified noninflammatory disorders of vagina: Secondary | ICD-10-CM

## 2020-07-29 LAB — WET PREP FOR TRICH, YEAST, CLUE

## 2020-07-29 NOTE — Progress Notes (Signed)
   Acute Office Visit  Subjective:    Patient ID: Molly Prince, female    DOB: Jun 25, 1971, 49 y.o.   MRN: [id number]   HPI 49 y.o. presents today for intermittent vaginal odor without itching or discharge. This is not new for her and has been seen in the past with negative work up. She notices it more when she has spotting. Mirena IUD. Denies need for STD screening.    Review of Systems  Constitutional: Negative.   Genitourinary: Negative for dyspareunia, dysuria, frequency, urgency, vaginal bleeding, vaginal discharge and vaginal pain.       Vaginal odor       Objective:    Physical Exam Constitutional:      Appearance: Normal appearance.  Genitourinary:    General: Normal vulva.     Vagina: Normal.     Cervix: Normal.     Uterus: Normal.      Comments: IUD string visible in os    There were no vitals taken for this visit. Wt Readings from Last 3 Encounters:  07/21/20 107 lb 8 oz (48.8 kg)  06/25/20 103 lb (46.7 kg)  10/02/19 108 lb 1.6 oz (49 kg)   UA negative Wet prep negative     Assessment & Plan:   Problem List Items Addressed This Visit    None    Visit Diagnoses    Vaginal odor    -  Primary   Relevant Orders   WET PREP FOR [place], YEAST, CLUE   Abnormal urine odor       Relevant Orders   Urinalysis,Complete w/RFL Culture     Plan: Wet prep and UA unremarkable. Likely related to intermittent spotting. Discussed good hygiene practices, avoid washing with soaps that have fragrance, wear cotton underwear, and loose fitting clothing. She is agreeable to plan.      Tamela Gammon [facility], 4:43 PM 07/29/2020

## 2020-07-31 LAB — URINALYSIS, COMPLETE W/RFL CULTURE
Bilirubin Urine: NEGATIVE
Glucose, UA: NEGATIVE
Hgb urine dipstick: NEGATIVE
Hyaline Cast: NONE SEEN /LPF
Ketones, ur: NEGATIVE
Nitrites, Initial: NEGATIVE
Protein, ur: NEGATIVE
RBC / HPF: NONE SEEN /HPF (ref 0–2)
Specific Gravity, Urine: 1.015 (ref 1.001–1.03)
pH: 7 (ref 5.0–8.0)

## 2020-07-31 LAB — URINE CULTURE
MICRO NUMBER:: 10982101
SPECIMEN QUALITY:: ADEQUATE

## 2020-07-31 LAB — CULTURE INDICATED

## 2020-09-01 ENCOUNTER — Encounter: Payer: Self-pay | Admitting: Internal Medicine

## 2020-09-02 ENCOUNTER — Encounter: Payer: Self-pay | Admitting: Internal Medicine

## 2020-09-02 ENCOUNTER — Ambulatory Visit: Payer: BC Managed Care – PPO | Admitting: Internal Medicine

## 2020-09-02 ENCOUNTER — Other Ambulatory Visit: Payer: Self-pay

## 2020-09-02 VITALS — BP 92/62 | HR 69 | Ht 59.0 in | Wt 108.6 lb

## 2020-09-02 DIAGNOSIS — E890 Postprocedural hypothyroidism: Secondary | ICD-10-CM

## 2020-09-02 DIAGNOSIS — C73 Malignant neoplasm of thyroid gland: Secondary | ICD-10-CM | POA: Diagnosis not present

## 2020-09-02 MED ORDER — LEVOTHYROXINE SODIUM 75 MCG PO TABS
ORAL_TABLET | ORAL | 3 refills | Status: DC
Start: 2020-09-02 — End: 2020-10-20

## 2020-09-02 NOTE — Progress Notes (Signed)
Patient ID: Molly Prince, female   DOB: Dec 29, 1970, 49 y.o.   MRN: [id number]  This visit occurred during the SARS-CoV-2 public health emergency.  Safety protocols were in place, including screening questions prior to the visit, additional usage of staff PPE, and extensive cleaning of exam room while observing appropriate contact time as indicated for disinfecting solutions.   HPI  Molly Prince is a 49 y.o.-year-old female, initially referred by Dr. Harlow Asa, for management of thyroid cancer and postsurgical hypothyroidism. Last visit 1 year ago.  Papillary thyroid cancer -Diagnosed in 01/2016.  Reviewed her thyroid cancer history: 10/27/2015: Thyroid ultrasound:  Right thyroid lobe Measurements: 3.9 x 1.0 x 1.1 cm. No nodules visualized.  Left thyroid lobe Measurements: 3.5 x 1.8 x 1.8 cm. 2.3 x 1.8 x 1.8 cm heterogeneous solid mass within the left thyroid lobe. Smaller adjacent 8 mm solid nodule.  Isthmus Thickness: 2 mm in thickness. No nodules visualized. 11/26/2015: FNA: PTC ([facility] category VI) 01/08/2016: Total thyroidectomy-Final pathology: Diagnosis Thyroid, thyroidectomy - PAPILLARY THYROID CARCINOMA, 2.4 CM. - CARCINOMA IS CONFINED TO THE THYROID. - THE SURGICAL RESECTION MARGINS ARE NEGATIVE FOR CARCINOMA. - SEE ONCOLOGY TABLE BELOW. Microscopic Comment THYROID Specimen: Thyroid. Procedure (including lymph node sampling if applicable): Total thyroidectomy. Specimen Integrity (intact/fragmented): Intact. Tumor focality: Unifocal Dominant tumor: Maximum tumor size (cm): 2.4 cm (gross measurement). Tumor laterality: Left lobe. Histologic type (including subtype and/or unique features as applicable): Papillary thyroid carcinoma. Tumor capsule: Not present. Extrathyroidal extension: Not identified. Capsular invasion with degree of invasion if present: N/A Margins: Negative for carcinoma. Lymphatic or vascular invasion: Not identified. Lymph nodes: # examined  - 0; # positive; N/A Extracapsular extension (if applicable): Not identified. TNM code: pT2, pNX Non-neoplastic thyroid: No significant findings. (JBK:gt, 01/11/16) 06/30/2016: RAI treatment 72 mCi 07/08/2016: Post treatment whole-body scan: negative for metastases 09/12/2017: Neck ultrasound: No residual/ recurrent tissue post thyroidectomy.  Thyroglobulin remains at the lower limit of detection, while ATA antibodies are not detectable: Lab Results  Component Value Date   THYROGLB 0.1 (L) 10/16/2019   THYROGLB 0.1 (L) 08/31/2018   THYROGLB 0.1 (L) 09/05/2017   THYROGLB 0.1 (L) 03/07/2017   THYROGLB 0.2 (L) 09/07/2016   THGAB <1 10/16/2019   THGAB <1 08/31/2018   THGAB <1 09/05/2017   THGAB <1 03/07/2017   THGAB <1 09/07/2016   Postsurgical hypothyroidism  Pt is on Synthroid 50 alternating with 75 mcg daily: - in am - fasting - at least 2 from-3h b'fast - + calcium at night - no iron - + multivitamins at night (without iron) - no PPIs - not on Biotin  Reviewed her TFTs: Lab Results  Component Value Date   TSH 3.18 06/25/2020   TSH 4.15 01/15/2020   TSH 3.59 10/16/2019   TSH 0.78 08/22/2019   TSH 0.10 (L) 06/25/2019   TSH 0.54 10/23/2018   TSH 0.20 (L) 08/31/2018   TSH 1.69 09/05/2017   TSH 1.44 04/12/2017   TSH 0.13 (L) 03/07/2017   FREET4 0.93 01/15/2020   FREET4 1.07 10/16/2019   FREET4 0.89 08/22/2019   FREET4 1.17 10/23/2018   FREET4 1.08 08/31/2018   FREET4 0.87 09/05/2017   FREET4 1.20 04/12/2017   FREET4 1.28 03/07/2017   FREET4 0.85 09/07/2016   FREET4 1.11 03/07/2016    No FH of thyroid cancer. No h/o radiation tx to head or neck except for RAI treatment..  No herbal supplements. No Biotin use. No recent steroids use.   She had low calcium after surgery  but this normalized at last visit.  However, she had another low calcium recently in the setting of a low vitamin D: Lab Results  Component Value Date   CALCIUM 8.5 (L) 06/25/2020   CALCIUM 9.0  06/25/2019   CALCIUM 8.9 06/22/2018   CALCIUM 9.0 09/05/2017   CALCIUM 8.4 (L) 02/02/2017   CALCIUM 8.8 09/07/2016   CALCIUM 7.8 (L) 01/09/2016   CALCIUM 8.8 (L) 01/01/2016   CALCIUM 8.7 10/27/2015   CALCIUM 8.9 09/22/2014   + Vitamin D deficiency diagnosed recently: Lab Results  Component Value Date   VD25OH 19 (L) 06/25/2020   VD25OH 35 06/25/2019   VD25OH 33 06/22/2018   VD25OH 30.43 09/05/2017   VD25OH 30.10 03/07/2016   PCP started her on ergocalciferol >> will have a new appt with OgbGyn next month for a recheck.  ROS: Constitutional: no weight gain/no weight loss, no fatigue, no subjective hyperthermia, no subjective hypothermia Eyes: no blurry vision, no xerophthalmia ENT: no sore throat, + see HPI Cardiovascular: no CP/no SOB/no palpitations/no leg swelling Respiratory: no cough/no SOB/no wheezing Gastrointestinal: no N/no V/no D/no C/no acid reflux Musculoskeletal: no muscle aches/no joint aches Skin: no rashes, no hair loss Neurological: no tremors/no numbness/no tingling/no dizziness  I reviewed pt's medications, allergies, PMH, social hx, family hx, and changes were documented in the history of present illness. Otherwise, unchanged from my initial visit note.  Past Medical History:  Diagnosis Date  . Cancer [facility])    papillary thyroid carcinoma    Past Surgical History:  Procedure Laterality Date  . THYROIDECTOMY N/A 01/08/2016   Procedure: TOTAL THYROIDECTOMY;  Surgeon: Armandina Gemma, MD;  Location: [place];  Service: General;  Laterality: N/A;   Social History   Social History  . Marital Status: married    Spouse Name: N/A  . Number of Children: 3   Occupational History  . n/a   Social History Main Topics  . Smoking status: Never Smoker   . Smokeless tobacco: Not on file  . Alcohol Use: 0.0 oz/week    0 Standard drinks or equivalent per week     Comment: social - Tequila  . Drug Use: No  . Sexual Activity: Yes     Comment: IUD- MIRENA   Social  History Narrative   Age of menarche: 37 yrs   Gravida: 4 pregnancies                  3 children      Current Outpatient Medications on File Prior to Visit  Medication Sig Dispense Refill  . levonorgestrel (MIRENA) 20 MCG/24HR IUD 1 each by Intrauterine route once.    Marland Kitchen levothyroxine (SYNTHROID) 75 MCG tablet Take 75 mcg by mouth every day 90 tablet 3  . Vitamin D, Ergocalciferol, (DRISDOL) 1.25 MG (50000 UNIT) CAPS capsule Take 1 capsule (50,000 Units total) by mouth every 7 (seven) days. 12 capsule 0   No current facility-administered medications on file prior to visit.   No Known Allergies Family History  Problem Relation Age of Onset  . Hypertension Mother    PE: There were no vitals taken for this visit. Wt Readings from Last 3 Encounters:  07/21/20 107 lb 8 oz (48.8 kg)  06/25/20 103 lb (46.7 kg)  10/02/19 108 lb 1.6 oz (49 kg)   Constitutional: normal weight, in NAD Eyes: PERRLA, EOMI, no exophthalmos ENT: moist mucous membranes, no  neck masses palpated, thyroidectomy scar healed, no cervical lymphadenopathy Cardiovascular: RRR, No MRG Respiratory: CTA B Gastrointestinal:  abdomen soft, NT, ND, BS+ Musculoskeletal: no deformities, strength intact in all 4 Skin: moist, warm, no rashes Neurological: no tremor with outstretched hands, DTR normal in all 4  ASSESSMENT: 1. Thyroid cancer - see HPI  2. Postsurgical Hypothyroidism  3.  Postsurgical hypocalcemia  PLAN:  1. Thyroid cancer - papillary -We discussed that she has very good prognosis and her thyroid cancer is unlikely to affect her life expectancy or quality of life -Her tumor was larger than 1.5 cm so we proceeded with RAI treatment for postop thyroid remnant ablation.  We discussed at that time that the main role of this is to facilitate monitoring in the long run, by checking thyroglobulin.  She had RAI with Thyrogen in 06/2016.  Posttreatment whole-body scan was negative for metastasis or extension in the  neck.  We repeated a neck ultrasound at the end of 2018 and is negative for abnormal mass -Latest thyroglobulin levels remain detectable but at the lower limit of detection.  Antithyroglobulin antibodies are negative -We will repeat these at next lab draw  2. Patient with history of total thyroidectomy for thyroid cancer, now with iatrogenic hypothyroidism - latest thyroid labs reviewed with pt >> normal, but higher than target so increase the dose of Synthroid: Lab Results  Component Value Date   TSH 3.18 06/25/2020   - she did not change Synthroid dose as advised, increasing to 75 mcg daily (?  Did not get the message) and she continues alternating 50 with 75 mcg daily - pt feels good on this dose, but I explained that the TSH is still slightly higher than our target for her in the setting of thyroid cancer.  We will go ahead and increase her Synthroid dose to 75 mcg daily now. - we discussed about taking the thyroid hormone every day, with water, >30 minutes before breakfast, separated by >4 hours from acid reflux medications, calcium, iron, multivitamins. Pt. is taking it correctly. - will check thyroid tests in 1.5 months: TSH and fT4  3.  Postsurgical hypocalcemia -She had low calcium levels after the surgery, but normalized afterwards.  However, at last check in 06/2020, calcium was again low in the setting of a low vitamin D. -She is now back on calcium supplement-1 tablet, per OB/GYN -no perioral numbness or acral cramping -Latest vitamin D level was reviewed and this was low, at 19, in 06/2020 >> started vitamin D supplements: Ergocalciferol 50,000 units once a week-per OB/GYN.  She has a repeat level coming up next month.  Orders Placed This Encounter  Procedures  . TSH  . T4, free  . Thyroglobulin Level  . Thyroglobulin antibody   Philemon Kingdom, MD PhD [facility] Endocrinology

## 2020-09-02 NOTE — Patient Instructions (Addendum)
Please change: - Synthroid to 75 mcg daily  Take the thyroid hormone every day, with water, at least 30 minutes before breakfast, separated by at least 4 hours from: - acid reflux medications - calcium - iron - multivitamins  When you finish the weekly vitamin D, start daily 2000 units vitamin D.  Please come back for labs in 5-6 weeks.  Please come back for a follow-up appointment in 1 year.

## 2020-09-11 ENCOUNTER — Encounter: Payer: Self-pay | Admitting: Obstetrics & Gynecology

## 2020-09-11 ENCOUNTER — Other Ambulatory Visit: Payer: Self-pay

## 2020-09-11 ENCOUNTER — Ambulatory Visit (INDEPENDENT_AMBULATORY_CARE_PROVIDER_SITE_OTHER): Payer: BC Managed Care – PPO | Admitting: Obstetrics & Gynecology

## 2020-09-11 VITALS — BP 118/70

## 2020-09-11 DIAGNOSIS — Z30433 Encounter for removal and reinsertion of intrauterine contraceptive device: Secondary | ICD-10-CM | POA: Diagnosis not present

## 2020-09-11 NOTE — Progress Notes (Signed)
    Molly Prince Dec 29, 1970 [id number]        49 y.o.  [id number]   RP: Mirena IUD removal and insertion of new Mirena IUD  HPI: Well on Mirena IUD x 09/2015.  No BTB.  No pelvic pain.  No vaginal d/c.   OB History  Gravida Para Term Preterm AB Living  5 3     2 3   SAB TAB Ectopic Multiple Live Births               # Outcome Date GA Lbr Len/2nd Weight Sex Delivery Anes PTL Lv  5 AB           4 AB           3 Para           2 Para           1 Para             Past medical history,surgical history, problem list, medications, allergies, family history and social history were all reviewed and documented in the EPIC chart.   Directed ROS with pertinent positives and negatives documented in the history of present illness/assessment and plan.  Exam:  Vitals:   09/11/20 1049  BP: 118/70   General appearance:  Normal                                                                    IUD procedure note       Patient presented to the office today for removal and placement of Mirena IUD. The patient had previously been provided with literature information on this method of contraception. The risks benefits and pros and cons were discussed and all her questions were answered. She is fully aware that this form of contraception is 99% effective and is good for 5 years.  Pelvic exam: Vulva normal Vagina: No lesions or discharge Cervix: No lesions or discharge.  IUD strings visible at the EO.  Easy removal of IUD by pulling on the strings with a fenestrated clamp.  IUD complete, intact. Uterus: AV position Adnexa: No masses or tenderness Rectal exam: Not done  The cervix was cleansed with Betadine solution. Hurricane spray on the cervix.  A single-tooth tenaculum was placed on the anterior cervical lip. Os finder to dilate the cervix, hysterometry at 7 cm.  The IUD was shown to the patient and inserted in a sterile fashion.  The IUD string was trimmed. The single-tooth tenaculum was  removed. Patient was instructed to return back to the office in one month for follow up.        Assessment/Plan:  49 y.o. [id number]   1. Encounter for IUD removal and reinsertion Time to change Mirena IUD after 5 years.  Easy removal of Mirena IUD which was complete and intact.  Insertion of a new Mirena IUD without difficulty or complication.  Well-tolerated by patient.  Postprocedure precautions reviewed.  Follow-up in 4 weeks for IUD check.  Molly Bruins MD, 11:17 AM 09/11/2020

## 2020-09-15 ENCOUNTER — Encounter: Payer: Self-pay | Admitting: Anesthesiology

## 2020-09-21 ENCOUNTER — Other Ambulatory Visit: Payer: Self-pay | Admitting: Obstetrics & Gynecology

## 2020-10-07 ENCOUNTER — Other Ambulatory Visit: Payer: BC Managed Care – PPO

## 2020-10-09 ENCOUNTER — Ambulatory Visit: Payer: BC Managed Care – PPO | Admitting: Obstetrics & Gynecology

## 2020-10-14 ENCOUNTER — Other Ambulatory Visit: Payer: BC Managed Care – PPO

## 2020-10-16 ENCOUNTER — Other Ambulatory Visit (INDEPENDENT_AMBULATORY_CARE_PROVIDER_SITE_OTHER): Payer: BC Managed Care – PPO

## 2020-10-16 ENCOUNTER — Other Ambulatory Visit: Payer: Self-pay

## 2020-10-16 DIAGNOSIS — E890 Postprocedural hypothyroidism: Secondary | ICD-10-CM | POA: Diagnosis not present

## 2020-10-16 DIAGNOSIS — C73 Malignant neoplasm of thyroid gland: Secondary | ICD-10-CM

## 2020-10-16 LAB — TSH: TSH: 2.15 u[IU]/mL (ref 0.35–4.50)

## 2020-10-16 LAB — T4, FREE: Free T4: 1.01 ng/dL (ref 0.60–1.60)

## 2020-10-19 ENCOUNTER — Ambulatory Visit: Payer: BC Managed Care – PPO | Admitting: Obstetrics & Gynecology

## 2020-10-19 LAB — THYROGLOBULIN LEVEL: Thyroglobulin: 0.1 ng/mL — ABNORMAL LOW

## 2020-10-19 LAB — THYROGLOBULIN ANTIBODY: Thyroglobulin Ab: <1 IU/mL (ref <=1)

## 2020-10-20 ENCOUNTER — Encounter: Payer: Self-pay | Admitting: Obstetrics & Gynecology

## 2020-10-20 ENCOUNTER — Other Ambulatory Visit: Payer: Self-pay

## 2020-10-20 ENCOUNTER — Other Ambulatory Visit: Payer: Self-pay | Admitting: Internal Medicine

## 2020-10-20 ENCOUNTER — Ambulatory Visit: Payer: BC Managed Care – PPO | Admitting: Obstetrics & Gynecology

## 2020-10-20 VITALS — BP 118/76

## 2020-10-20 DIAGNOSIS — Z30431 Encounter for routine checking of intrauterine contraceptive device: Secondary | ICD-10-CM

## 2020-10-20 DIAGNOSIS — E890 Postprocedural hypothyroidism: Secondary | ICD-10-CM

## 2020-10-20 DIAGNOSIS — R7989 Other specified abnormal findings of blood chemistry: Secondary | ICD-10-CM | POA: Diagnosis not present

## 2020-10-20 DIAGNOSIS — E7800 Pure hypercholesterolemia, unspecified: Secondary | ICD-10-CM

## 2020-10-20 MED ORDER — LEVOTHYROXINE SODIUM 88 MCG PO TABS
ORAL_TABLET | ORAL | 3 refills | Status: DC
Start: 2020-10-20 — End: 2020-11-25

## 2020-10-20 NOTE — Progress Notes (Signed)
    Molly Prince 15-Nov-1970 [id number]        49 y.o. [id number]   RP: IUD check and recheck labs  HPI: No pelvic pain.  No abnormal bleeding.  No vaginal discharge.  No fever.   OB History  Gravida Para Term Preterm AB Living  5 3     2 3   SAB IAB Ectopic Multiple Live Births               # Outcome Date GA Lbr Len/2nd Weight Sex Delivery Anes PTL Lv  5 AB           4 AB           3 Para           2 Para           1 Para             Past medical history,surgical history, problem list, medications, allergies, family history and social history were all reviewed and documented in the EPIC chart.   Directed ROS with pertinent positives and negatives documented in the history of present illness/assessment and plan.  Exam:  Vitals:   10/20/20 1020  BP: 118/76   General appearance:  Normal  Abdomen: Normal  Gynecologic exam: Vulva normal.  Speculum:  Cervix normal, no erythema.  Strings visible at the EO.     Assessment/Plan:  49 y.o. [id number]   1. Encounter for routine checking of intrauterine contraceptive device (IUD) Mirena IUD well-tolerated by patient. IUD in good location with strings seen at the exocervix.  No sign of infection.  Patient reassured.  2. Low vitamin D level - VITAMIN D 25 Hydroxy (Vit-D Deficiency, Fractures)  3. Low calcium levels - Calcium  4. High cholesterol - Lipid panel  [name] MD, 10:25 AM 10/20/2020

## 2020-10-21 LAB — LIPID PANEL
Cholesterol: 186 mg/dL (ref <200)
HDL: 59 mg/dL (ref >=50)
LDL Cholesterol (Calc): 113 mg/dL (calc) — ABNORMAL HIGH
Non-HDL Cholesterol (Calc): 127 mg/dL (calc) (ref <130)
Total CHOL/HDL Ratio: 3.2 (calc) (ref <5.0)
Triglycerides: 59 mg/dL (ref <150)

## 2020-10-21 LAB — CALCIUM: Calcium: 8.5 mg/dL — ABNORMAL LOW (ref 8.6–10.4)

## 2020-10-21 LAB — VITAMIN D 25 HYDROXY (VIT D DEFICIENCY, FRACTURES): Vit D, 25-Hydroxy: 48 ng/mL (ref 30–100)

## 2020-11-25 ENCOUNTER — Other Ambulatory Visit: Payer: Self-pay | Admitting: Internal Medicine

## 2020-11-25 ENCOUNTER — Other Ambulatory Visit: Payer: Self-pay

## 2020-11-25 ENCOUNTER — Other Ambulatory Visit (INDEPENDENT_AMBULATORY_CARE_PROVIDER_SITE_OTHER): Payer: BC Managed Care – PPO

## 2020-11-25 DIAGNOSIS — E890 Postprocedural hypothyroidism: Secondary | ICD-10-CM

## 2020-11-25 LAB — TSH: TSH: 0.11 u[IU]/mL — ABNORMAL LOW (ref 0.35–4.50)

## 2020-11-25 LAB — T4, FREE: Free T4: 1.19 ng/dL (ref 0.60–1.60)

## 2020-11-25 MED ORDER — LEVOTHYROXINE SODIUM 88 MCG PO TABS: ORAL_TABLET | ORAL | 3 refills | Status: DC

## 2020-11-25 MED ORDER — LEVOTHYROXINE SODIUM 75 MCG PO TABS: ORAL_TABLET | ORAL | 3 refills | Status: DC

## 2020-11-26 ENCOUNTER — Other Ambulatory Visit: Payer: BC Managed Care – PPO

## 2021-01-12 ENCOUNTER — Other Ambulatory Visit (INDEPENDENT_AMBULATORY_CARE_PROVIDER_SITE_OTHER): Payer: BC Managed Care – PPO

## 2021-01-12 ENCOUNTER — Other Ambulatory Visit: Payer: Self-pay

## 2021-01-12 DIAGNOSIS — E890 Postprocedural hypothyroidism: Secondary | ICD-10-CM | POA: Diagnosis not present

## 2021-01-12 LAB — T4, FREE: Free T4: 1.24 ng/dL (ref 0.60–1.60)

## 2021-01-12 LAB — TSH: TSH: 0.08 u[IU]/mL — ABNORMAL LOW (ref 0.35–4.50)

## 2021-01-22 ENCOUNTER — Telehealth: Payer: Self-pay

## 2021-01-22 NOTE — Telephone Encounter (Signed)
-----   Message from Philemon Kingdom, MD sent at 01/22/2021 11:39 AM EDT ----- T, can you please see if the patient received the message that I sent her several days ago?  It appears that she did not respond yet.  I am not sure if she understands it-may need the Spanish interpreter.

## 2021-01-22 NOTE — Telephone Encounter (Signed)
Called pt via interpreter advised of lab results and confirmed she has adjusted her med and is currently only taking 75 mcg.

## 2021-03-05 ENCOUNTER — Other Ambulatory Visit: Payer: Self-pay

## 2021-03-05 ENCOUNTER — Other Ambulatory Visit: Payer: Self-pay | Admitting: Internal Medicine

## 2021-03-05 ENCOUNTER — Other Ambulatory Visit (INDEPENDENT_AMBULATORY_CARE_PROVIDER_SITE_OTHER): Payer: BC Managed Care – PPO

## 2021-03-05 DIAGNOSIS — E890 Postprocedural hypothyroidism: Secondary | ICD-10-CM | POA: Diagnosis not present

## 2021-03-05 LAB — T4, FREE: Free T4: 1.12 ng/dL (ref 0.60–1.60)

## 2021-03-05 LAB — TSH: TSH: 0.37 u[IU]/mL (ref 0.35–4.50)

## 2021-03-05 NOTE — Telephone Encounter (Signed)
Pt lab results were sent to her via mychart pt seen results

## 2021-03-05 NOTE — Telephone Encounter (Signed)
Patient called to find out if she can get her Thyroid labs done today (03/05/21).  Please review and advise  Call back # [phone]

## 2021-03-08 LAB — VITAMIN D 25 HYDROXY (VIT D DEFICIENCY, FRACTURES): Vit D, 25-Hydroxy: 36.9 ng/mL (ref 30.0–100.0)

## 2021-03-08 LAB — CALCIUM, IONIZED: Calcium, Ion: 4.7 mg/dL (ref 4.5–5.6)

## 2021-06-30 ENCOUNTER — Ambulatory Visit: Payer: Self-pay | Admitting: Obstetrics & Gynecology

## 2021-07-30 DIAGNOSIS — M25562 Pain in left knee: Secondary | ICD-10-CM | POA: Diagnosis not present

## 2021-07-30 DIAGNOSIS — M25561 Pain in right knee: Secondary | ICD-10-CM | POA: Diagnosis not present

## 2021-08-26 ENCOUNTER — Other Ambulatory Visit: Payer: Self-pay | Admitting: Obstetrics & Gynecology

## 2021-08-26 DIAGNOSIS — Z1231 Encounter for screening mammogram for malignant neoplasm of breast: Secondary | ICD-10-CM

## 2021-08-31 DIAGNOSIS — M25561 Pain in right knee: Secondary | ICD-10-CM | POA: Diagnosis not present

## 2021-08-31 DIAGNOSIS — M25562 Pain in left knee: Secondary | ICD-10-CM | POA: Diagnosis not present

## 2021-09-01 ENCOUNTER — Other Ambulatory Visit: Payer: Self-pay

## 2021-09-01 ENCOUNTER — Ambulatory Visit
Admission: RE | Admit: 2021-09-01 | Discharge: 2021-09-01 | Disposition: A | Discharge status: Home / Self Care | Payer: BC Managed Care – PPO | Source: Ambulatory Visit | Attending: Obstetrics & Gynecology | Admitting: Obstetrics & Gynecology

## 2021-09-01 DIAGNOSIS — Z1231 Encounter for screening mammogram for malignant neoplasm of breast: Secondary | ICD-10-CM | POA: Diagnosis not present

## 2021-09-06 ENCOUNTER — Encounter: Payer: Self-pay | Admitting: Internal Medicine

## 2021-09-08 ENCOUNTER — Encounter: Payer: Self-pay | Admitting: Internal Medicine

## 2021-09-08 ENCOUNTER — Ambulatory Visit (INDEPENDENT_AMBULATORY_CARE_PROVIDER_SITE_OTHER): Payer: BC Managed Care – PPO | Admitting: Internal Medicine

## 2021-09-08 ENCOUNTER — Other Ambulatory Visit: Payer: Self-pay

## 2021-09-08 VITALS — BP 100/68 | HR 61 | Ht 59.0 in | Wt 111.0 lb

## 2021-09-08 DIAGNOSIS — E559 Vitamin D deficiency, unspecified: Secondary | ICD-10-CM

## 2021-09-08 DIAGNOSIS — E890 Postprocedural hypothyroidism: Secondary | ICD-10-CM

## 2021-09-08 DIAGNOSIS — C73 Malignant neoplasm of thyroid gland: Secondary | ICD-10-CM | POA: Diagnosis not present

## 2021-09-08 LAB — TSH: TSH: 0.56 u[IU]/mL (ref 0.35–5.50)

## 2021-09-08 LAB — T4, FREE: Free T4: 1.09 ng/dL (ref 0.60–1.60)

## 2021-09-08 LAB — VITAMIN D 25 HYDROXY (VIT D DEFICIENCY, FRACTURES): VITD: 33.9 ng/mL (ref 30.00–100.00)

## 2021-09-08 NOTE — Progress Notes (Signed)
Patient ID: Molly Prince, female   DOB: Jan 10, 1971, 50 y.o.   MRN: [id number]  This visit occurred during the SARS-CoV-2 public health emergency.  Safety protocols were in place, including screening questions prior to the visit, additional usage of staff PPE, and extensive cleaning of exam room while observing appropriate contact time as indicated for disinfecting solutions.   HPI  Molly Prince is a 50 y.o.-year-old female, initially referred by Dr. Harlow Prince, for management of thyroid cancer and postsurgical hypothyroidism. Last visit 1 year ago.  Interim history: She denies dysphagia or neck pressure.  Also, no choking or masses felt in the neck. No numbness around her mouth or cramps in her hands.  Papillary thyroid cancer -Diagnosed in 01/2016.  Reviewed her thyroid cancer history: 10/27/2015: Thyroid ultrasound: Right thyroid lobe Measurements: 3.9 x 1.0 x 1.1 cm.  No nodules visualized. Left thyroid lobe Measurements: 3.5 x 1.8 x 1.8 cm. 2.3 x 1.8 x 1.8 cm heterogeneous solid mass within the left thyroid lobe. Smaller adjacent 8 mm solid nodule. Isthmus Thickness: 2 mm in thickness.  No nodules visualized. 11/26/2015: FNA: PTC (Bethesda category VI) 01/08/2016: Total thyroidectomy-Final pathology: Diagnosis Thyroid, thyroidectomy - PAPILLARY THYROID CARCINOMA, 2.4 CM. - CARCINOMA IS CONFINED TO THE THYROID. - THE SURGICAL RESECTION MARGINS ARE NEGATIVE FOR CARCINOMA. - SEE ONCOLOGY TABLE BELOW. Microscopic Comment THYROID Specimen: Thyroid. Procedure (including lymph node sampling if applicable): Total thyroidectomy. Specimen Integrity (intact/fragmented): Intact. Tumor focality: Unifocal Dominant tumor: Maximum tumor size (cm): 2.4 cm (gross measurement). Tumor laterality: Left lobe. Histologic type (including subtype and/or unique features as applicable): Papillary thyroid carcinoma. Tumor capsule: Not present. Extrathyroidal extension: Not identified. Capsular  invasion with degree of invasion if present: N/A Margins: Negative for carcinoma. Lymphatic or vascular invasion: Not identified. Lymph nodes: # examined - 0; # positive; N/A Extracapsular extension (if applicable): Not identified. TNM code: pT2, pNX Non-neoplastic thyroid: No significant findings. (JBK:gt, 01/11/16) 06/30/2016: RAI treatment 72 mCi 07/08/2016: Post treatment whole-body scan: negative for metastases 09/12/2017: Neck ultrasound: No residual/ recurrent tissue post thyroidectomy.  Thyroglobulin remains at the lower limit of detection, while ATA antibodies are not detectable: Lab Results  Component Value Date   THYROGLB 0.1 (L) 10/16/2020   THYROGLB 0.1 (L) 10/16/2019   THYROGLB 0.1 (L) 08/31/2018   THYROGLB 0.1 (L) 09/05/2017   THYROGLB 0.1 (L) 03/07/2017   THYROGLB 0.2 (L) 09/07/2016   THGAB <1 10/16/2020   THGAB <1 10/16/2019   THGAB <1 08/31/2018   THGAB <1 09/05/2017   THGAB <1 03/07/2017   THGAB <1 09/07/2016   Postsurgical hypothyroidism  Pt is on Synthroid 75 mcg daily: - in am - fasting - at least 3h from b'fast - + calcium at night - no iron - stopped multivitamins at night  - no PPIs - not on Biotin  Reviewed her TFTs: Lab Results  Component Value Date   TSH 0.37 03/05/2021   TSH 0.08 (L) 01/12/2021   TSH 0.11 (L) 11/25/2020   TSH 2.15 10/16/2020   TSH 3.18 06/25/2020   TSH 4.15 01/15/2020   TSH 3.59 10/16/2019   TSH 0.78 08/22/2019   TSH 0.10 (L) 06/25/2019   TSH 0.54 10/23/2018   FREET4 1.12 03/05/2021   FREET4 1.24 01/12/2021   FREET4 1.19 11/25/2020   FREET4 1.01 10/16/2020   FREET4 0.93 01/15/2020   FREET4 1.07 10/16/2019   FREET4 0.89 08/22/2019   FREET4 1.17 10/23/2018   FREET4 1.08 08/31/2018   FREET4 0.87 09/05/2017    No  FH of thyroid cancer. No h/o radiation tx to head or neck except for RAI treatment..  No herbal supplements. No Biotin use. No recent steroids use.   She had low calcium after surgery but ionized  calcium was normal at last check: 03/05/2021: iCa 4.7 (4.5 - 5.6 mg/dL) Lab Results  Component Value Date   CALCIUM 8.5 (L) 10/20/2020   CALCIUM 8.5 (L) 06/25/2020   CALCIUM 9.0 06/25/2019   CALCIUM 8.9 06/22/2018   CALCIUM 9.0 09/05/2017   CALCIUM 8.4 (L) 02/02/2017   CALCIUM 8.8 09/07/2016   CALCIUM 7.8 (L) 01/09/2016   CALCIUM 8.8 (L) 01/01/2016   CALCIUM 8.7 10/27/2015   + Vitamin D deficiency diagnosed recently: Lab Results  Component Value Date   VD25OH 36.9 03/05/2021   VD25OH 48 10/20/2020   VD25OH 19 (L) 06/25/2020   VD25OH 35 06/25/2019   VD25OH 33 06/22/2018   VD25OH 30.43 09/05/2017   VD25OH 30.10 03/07/2016   She was on ergocalciferol 50,000 units weekly >> now 1000 units.  ROS: + see HPI  I reviewed pt's medications, allergies, PMH, social hx, family hx, and changes were documented in the history of present illness. Otherwise, unchanged from my initial visit note.  Past Medical History:  Diagnosis Date   Cancer [facility])    papillary thyroid carcinoma    Past Surgical History:  Procedure Laterality Date   THYROIDECTOMY N/A 01/08/2016   Procedure: TOTAL THYROIDECTOMY;  Surgeon: Armandina Gemma, MD;  Location: [place];  Service: General;  Laterality: N/A;   Social History   Social History   Marital Status: married    Spouse Name: N/A   Number of Children: 3   Occupational History   n/a   Social History Main Topics   Smoking status: Never Smoker    Smokeless tobacco: Not on file   Alcohol Use: 0.0 oz/week    0 Standard drinks or equivalent per week     Comment: social - Tequila   Drug Use: No   Sexual Activity: Yes     Comment: IUD- MIRENA   Social History Narrative   Age of menarche: 81 yrs   Gravida: 4 pregnancies                  3 children      Current Outpatient Medications on File Prior to Visit  Medication Sig Dispense Refill   levothyroxine (SYNTHROID) 75 MCG tablet Take 75 mcg by mouth every other day 45 tablet 3   levonorgestrel (MIRENA)  20 MCG/24HR IUD 1 each by Intrauterine route once.     levothyroxine (SYNTHROID) 88 MCG tablet Take 88 mcg by mouth every other day 45 tablet 3   Vitamin D, Ergocalciferol, (DRISDOL) 1.25 MG (50000 UNIT) CAPS capsule Take 1 capsule (50,000 Units total) by mouth every 7 (seven) days. 12 capsule 0   No current facility-administered medications on file prior to visit.   No Known Allergies Family History  Problem Relation Age of Onset   Hypertension Mother    PE: BP 100/68 (BP Location: Right Arm, Patient Position: Sitting, Cuff Size: Normal)   Pulse 61   Ht _0  (1.499 m)   Wt 111 lb (50.3 kg)   SpO2 96%   BMI 22.42 kg/m  Wt Readings from Last 3 Encounters:  09/08/21 111 lb (50.3 kg)  09/02/20 108 lb 9.6 oz (49.3 kg)  07/21/20 107 lb 8 oz (48.8 kg)   Constitutional: normal weight, in NAD Eyes: PERRLA, EOMI, no exophthalmos ENT: moist  mucous membranes, no  neck masses palpated, thyroidectomy scar healed, no cervical lymphadenopathy Cardiovascular: RRR, No MRG Respiratory: CTA B Gastrointestinal: abdomen soft, NT, ND, BS+ Musculoskeletal: no deformities, strength intact in all 4 Skin: moist, warm, no rashes Neurological: no tremor with outstretched hands, DTR normal in all 4  ASSESSMENT: 1. Thyroid cancer - see HPI  2. Postsurgical Hypothyroidism  3.  Postsurgical hypocalcemia  PLAN:  1. Thyroid cancer - papillary -We discussed that she has very good prognosis and her thyroid cancer is unlikely to affect her life expectancy or quality of life -Her tumor was larger than 1.5 cm so we proceeded with RAI treatment for postop thyroid remnant ablation.  We discussed at that time that the main role of this is to facilitate monitoring in the long run, by checking thyroglobulin.  She had RAI with Thyrogen in 06/2016.  Posttreatment whole-body scan was negative for metastasis or extension into the neck.  We checked another neck ultrasound at the end of 2018 and this was negative for  abnormal masses. -Latest thyroglobulin level was detectable but at the lower limit of detection.  Antithyroglobulin antibodies were negative. -We will recheck these today -Also, since its been 4 years since she last had a neck ultrasound, we discussed about repeating this now  2. Patient with history of total thyroidectomy for thyroid cancer, now with iatrogenic hypothyroidism - latest thyroid labs reviewed with pt. >> at goal: Lab Results  Component Value Date   TSH 0.37 03/05/2021  - she continues on Synthroid d.a.w. 75 mcg daily, dose increased at last visit - pt feels good on this dose. - we discussed about taking the thyroid hormone every day, with water, >30 minutes before breakfast, separated by >4 hours from acid reflux medications, calcium, iron, multivitamins. Pt. is taking it correctly. - will check thyroid tests today: TSH and fT4 - If labs are abnormal, she will need to return for repeat TFTs in 1.5 months  3.  Postsurgical hypocalcemia -She had low calcium levels after the surgery, but these normalized afterwards.  However, in 06/2020, calcium was again low in the setting of a low vitamin D. -At last visit, she was back on the calcium supplement, 1 tablet daily -latest calcium level was normal: Ionized calcium 4.7 on 03/05/2021 -No acral cramping or perioral numbness -We will recheck her ionized calcium level today  4.  Vitamin D deficiency: -Vitamin D was low, 19, in 06/2020 -She started vitamin D supplement at that time: Ergocalciferol 50,000 units once a week per OB/GYN in the past, however, at this visit, she tells me that she is still taking 1000 units vitamin D daily -Latest level was normal: Lab Results  Component Value Date   VD25OH 36.9 03/05/2021  -We will repeat her level today.  Component     Latest Ref Rng & Units 09/08/2021  Thyroglobulin     ng/mL 0.1 (L)  Comment        TSH     0.35 - 5.50 uIU/mL 0.56  T4,Free(Direct)     0.60 - 1.60 ng/dL 1.09   Thyroglobulin Ab     < or = 1 IU/mL <1  VITD     30.00 - 100.00 ng/mL 33.90  Calcium Ionized     4.8 - 5.6 mg/dL 4.64 (L)  Calcium level is only slightly low.  TFTs are normal.  Thyroglobulin barely detectable.  ATA antibodies undetectable. Would suggest to continue 1 tablet of calcium daily and continue the rest of the regimen  Philemon Kingdom, MD PhD [facility]

## 2021-09-08 NOTE — Patient Instructions (Addendum)
Please continue: - Synthroid 75 mcg daily  Take the thyroid hormone every day, with water, at least 30 minutes before breakfast, separated by at least 4 hours from: - acid reflux medications - calcium - iron - multivitamins  Please continue 1000 units vitamin D.  Please stop at the lab.  Please come back for a follow-up appointment in 1 year.

## 2021-09-09 LAB — THYROGLOBULIN LEVEL: Thyroglobulin: 0.1 ng/mL — ABNORMAL LOW

## 2021-09-09 LAB — CALCIUM, IONIZED: Calcium, Ion: 4.64 mg/dL — ABNORMAL LOW (ref 4.8–5.6)

## 2021-09-09 LAB — THYROGLOBULIN ANTIBODY: Thyroglobulin Ab: <1 IU/mL (ref <=1)

## 2021-09-10 ENCOUNTER — Ambulatory Visit
Admission: RE | Admit: 2021-09-10 | Discharge: 2021-09-10 | Disposition: A | Discharge status: Home / Self Care | Payer: BC Managed Care – PPO | Source: Ambulatory Visit | Attending: Internal Medicine | Admitting: Internal Medicine

## 2021-09-10 DIAGNOSIS — E890 Postprocedural hypothyroidism: Secondary | ICD-10-CM | POA: Diagnosis not present

## 2021-09-10 DIAGNOSIS — C73 Malignant neoplasm of thyroid gland: Secondary | ICD-10-CM

## 2021-09-10 DIAGNOSIS — Z85850 Personal history of malignant neoplasm of thyroid: Secondary | ICD-10-CM | POA: Diagnosis not present

## 2021-09-20 ENCOUNTER — Telehealth: Payer: Self-pay | Admitting: Internal Medicine

## 2021-09-20 DIAGNOSIS — E890 Postprocedural hypothyroidism: Secondary | ICD-10-CM

## 2021-09-20 MED ORDER — LEVOTHYROXINE SODIUM 75 MCG PO TABS: ORAL_TABLET | ORAL | 1 refills | Status: DC

## 2021-09-20 NOTE — Telephone Encounter (Signed)
Pt is taking Levothyroxine 67mcg. Then pt prescribed 17mcg. Pt checked Thyroid results were in normal range. Wants to now why she is prescribed 46mcgs. Pt has been taking the 84mcgs.   Pt [phone]

## 2021-09-20 NOTE — Telephone Encounter (Signed)
Called and explained to pt back in January of 2022 after some lab results her dose was adjusted and she was taking 75 mcg and alternating with the 88 mcg. Pt had labs retaken in March of 2022 and her dose was changed to 75 mcg daily. Pt had labs taken this month and dose has not been adjusted again. Pt advised she needed a refill for the 62mcg dose as her pharmacy is refilling the 88 mcg dose. Rx sent to preferred pharmacy.

## 2021-09-21 ENCOUNTER — Other Ambulatory Visit: Payer: Self-pay

## 2021-09-21 ENCOUNTER — Ambulatory Visit (INDEPENDENT_AMBULATORY_CARE_PROVIDER_SITE_OTHER): Payer: BC Managed Care – PPO | Admitting: Obstetrics & Gynecology

## 2021-09-21 ENCOUNTER — Encounter: Payer: Self-pay | Admitting: Obstetrics & Gynecology

## 2021-09-21 ENCOUNTER — Other Ambulatory Visit (HOSPITAL_COMMUNITY)
Admission: RE | Admit: 2021-09-21 | Discharge: 2021-09-21 | Disposition: A | Discharge status: Home / Self Care | Payer: BC Managed Care – PPO | Source: Ambulatory Visit | Attending: Obstetrics & Gynecology | Admitting: Obstetrics & Gynecology

## 2021-09-21 VITALS — BP 104/68 | HR 71 | Resp 16 | Ht 59.5 in | Wt 109.0 lb

## 2021-09-21 DIAGNOSIS — Z01419 Encounter for gynecological examination (general) (routine) without abnormal findings: Secondary | ICD-10-CM | POA: Diagnosis not present

## 2021-09-21 DIAGNOSIS — Z30431 Encounter for routine checking of intrauterine contraceptive device: Secondary | ICD-10-CM | POA: Diagnosis not present

## 2021-09-21 NOTE — Progress Notes (Signed)
Molly Prince [birth year] [id number]   History:    50 y.o. G5P3A2L3 Married.  Children: 15 yo daughter in [place], 32 yo and 51 yo   RP:  Established patient presenting for annual gyn exam    HPI: Well on Mirena IUD x 09/2020.  No menses, no BTB.  No pelvic pain.  No pain with IC.  Pap Neg 2020.  Urine/BMs normal. Breasts wnl, no pain and no lumps currently.  Mammo Neg 08/2021, Cat D. BMI 21.65.  Physically active, walking and swimming/healthy nutrition.  Fasting health labs here today.  History of thyroidectomy for thyroid cancer on Synthroid.  Fasting labs here today.  Recommend scheduling a Col[facility].   Past medical history,surgical history, family history and social history were all reviewed and documented in the EPIC chart.  Gynecologic History No LMP recorded. (Menstrual status: IUD).  Obstetric History OB History  Gravida Para Term Preterm AB Living  5 3     2 3   SAB IAB Ectopic Multiple Live Births               # Outcome Date GA Lbr Len/2nd Weight Sex Delivery Anes PTL Lv  5 AB           4 AB           3 Para           2 Para           1 Para              ROS: A ROS was performed and pertinent positives and negatives are included in the history.  GENERAL: No fevers or chills. HEENT: No change in vision, no earache, sore throat or sinus congestion. NECK: No pain or stiffness. CARDIOVASCULAR: No chest pain or pressure. No palpitations. PULMONARY: No shortness of breath, cough or wheeze. GASTROINTESTINAL: No abdominal pain, nausea, vomiting or diarrhea, melena or bright red blood per rectum. GENITOURINARY: No urinary frequency, urgency, hesitancy or dysuria. MUSCULOSKELETAL: No joint or muscle pain, no back pain, no recent trauma. DERMATOLOGIC: No rash, no itching, no lesions. ENDOCRINE: No polyuria, polydipsia, no heat or cold intolerance. No recent change in weight. HEMATOLOGICAL: No anemia or easy bruising or bleeding. NEUROLOGIC: No headache, seizures, numbness, tingling  or weakness. PSYCHIATRIC: No depression, no loss of interest in normal activity or change in sleep pattern.     Exam:   BP 104/68   Pulse 71   Resp 16   Ht 4' 11.5" (1.511 m)   Wt 109 lb (49.4 kg)   BMI 21.65 kg/m   Body mass index is 21.65 kg/m.  General appearance : Well developed well nourished female. No acute distress HEENT: Eyes: no retinal hemorrhage or exudates,  Neck supple, trachea midline, no carotid bruits, no thyroidmegaly Lungs: Clear to auscultation, no rhonchi or wheezes, or rib retractions  Heart: Regular rate and rhythm, no murmurs or gallops Breast:Examined in sitting and supine position were symmetrical in appearance, no palpable masses or tenderness,  no skin retraction, no nipple inversion, no nipple discharge, no skin discoloration, no axillary or supraclavicular lymphadenopathy Abdomen: no palpable masses or tenderness, no rebound or guarding Extremities: no edema or skin discoloration or tenderness  Pelvic: Vulva: Normal             Vagina: No gross lesions or discharge  Cervix: No gross lesions or discharge.  IUD strings visible.  Pap Reflex done.  Uterus  AV, normal size, shape and consistency, non-tender  and mobile  Adnexa  Without masses or tenderness  Anus: Normal   Assessment/Plan:  50 y.o. female for annual exam   1. Encounter for routine gynecological examination with Papanicolaou smear of cervix Well on Mirena IUD x 09/2020.  No menses, no BTB.  No pelvic pain.  No pain with IC.  Pap Neg 2020.  Pap test done today.  Urine/BMs normal. Breasts wnl, no pain and no lumps currently.  Mammo Neg 08/2021, Cat D. BMI 21.65.  Physically active, walking and swimming/healthy nutrition.  Fasting health labs here today.  History of thyroidectomy for thyroid cancer on Synthroid.  Fasting labs here today.  Recommend scheduling a Colono. - CBC - Comp Met (CMET) - Lipid Profile - TSH - Vitamin D 1,25 dihydroxy - Cytology - PAP( [place])  2. Encounter  for routine checking of intrauterine contraceptive device (IUD) Mirena IUD well tolerated x 09/2020.  In good position.  Other orders - VITAMIN D PO; Take by mouth.   [name] MD, 12:27 PM 09/21/2021

## 2021-09-23 NOTE — Telephone Encounter (Signed)
Patient requests to be called at ph# [phone] re: discuss lab results that Patient received on 09/21/21 and Thyroid medication Levothyroxine (dosage change?)

## 2021-09-24 LAB — COMPREHENSIVE METABOLIC PANEL
AG Ratio: 1.7 (calc) (ref 1.0–2.5)
ALT: 14 U/L (ref 6–29)
AST: 18 U/L (ref 10–35)
Albumin: 4.3 g/dL (ref 3.6–5.1)
Alkaline phosphatase (APISO): 64 U/L (ref 37–153)
BUN: 12 mg/dL (ref 7–25)
CO2: 29 mmol/L (ref 20–32)
Calcium: 8.6 mg/dL (ref 8.6–10.4)
Chloride: 102 mmol/L (ref 98–110)
Creat: 0.59 mg/dL (ref 0.50–1.03)
Globulin: 2.6 g/dL (calc) (ref 1.9–3.7)
Glucose, Bld: 70 mg/dL (ref 65–99)
Potassium: 4 mmol/L (ref 3.5–5.3)
Sodium: 138 mmol/L (ref 135–146)
Total Bilirubin: 0.5 mg/dL (ref 0.2–1.2)
Total Protein: 6.9 g/dL (ref 6.1–8.1)

## 2021-09-24 LAB — LIPID PANEL
Cholesterol: 181 mg/dL (ref <200)
HDL: 59 mg/dL (ref >=50)
LDL Cholesterol (Calc): 108 mg/dL (calc) — ABNORMAL HIGH
Non-HDL Cholesterol (Calc): 122 mg/dL (calc) (ref <130)
Total CHOL/HDL Ratio: 3.1 (calc) (ref <5.0)
Triglycerides: 47 mg/dL (ref <150)

## 2021-09-24 LAB — CBC
HCT: 40.5 % (ref 35.0–45.0)
Hemoglobin: 13.7 g/dL (ref 11.7–15.5)
MCH: 30.6 pg (ref 27.0–33.0)
MCHC: 33.8 g/dL (ref 32.0–36.0)
MCV: 90.6 fL (ref 80.0–100.0)
MPV: 11 fL (ref 7.5–12.5)
Platelets: 261 10*3/uL (ref 140–400)
RBC: 4.47 10*6/uL (ref 3.80–5.10)
RDW: 12.9 % (ref 11.0–15.0)
WBC: 7.3 10*3/uL (ref 3.8–10.8)

## 2021-09-24 LAB — TSH: TSH: 0.25 mIU/L — ABNORMAL LOW

## 2021-09-24 LAB — VITAMIN D 1,25 DIHYDROXY
Vitamin D 1, 25 (OH)2 Total: 49 pg/mL (ref 18–72)
Vitamin D2 1, 25 (OH)2: <8 pg/mL
Vitamin D3 1, 25 (OH)2: 49 pg/mL

## 2021-09-24 NOTE — Telephone Encounter (Signed)
Actually, if she is feeling well, I would like to continue the same dose of levothyroxine for now.  We do target a TSH at the lower limit of normal or can even be slightly lower for thyroid cancer.

## 2021-09-27 ENCOUNTER — Ambulatory Visit: Payer: BC Managed Care – PPO

## 2021-09-27 LAB — CYTOLOGY - PAP: Diagnosis: NEGATIVE

## 2021-09-27 NOTE — Addendum Note (Signed)
Addended by: Lauralyn Primes on: 09/27/2021 05:26 PM   Modules accepted: Orders

## 2021-09-27 NOTE — Telephone Encounter (Signed)
Contacted pt and advised to stay on current dose of Thyroid medication 75 mcg. Pt wants to come back in 8 weeks for labs to be re checked.

## 2021-09-27 NOTE — Telephone Encounter (Signed)
See below

## 2021-09-27 NOTE — Telephone Encounter (Signed)
OK, can you please order a TSH and free T4?

## 2021-09-27 NOTE — Telephone Encounter (Signed)
Labs ordered. Pt can be scheduled for lab appt.

## 2021-12-08 IMAGING — MG DIGITAL SCREENING BILAT W/ TOMO W/ CAD
8 series · 9 of 24 positions shown · non-contrast
Comparison: Previous exam(s).

CLINICAL DATA: Screening.

EXAM:
DIGITAL SCREENING BILATERAL MAMMOGRAM WITH TOMO AND CAD

[L CC synth-2D]
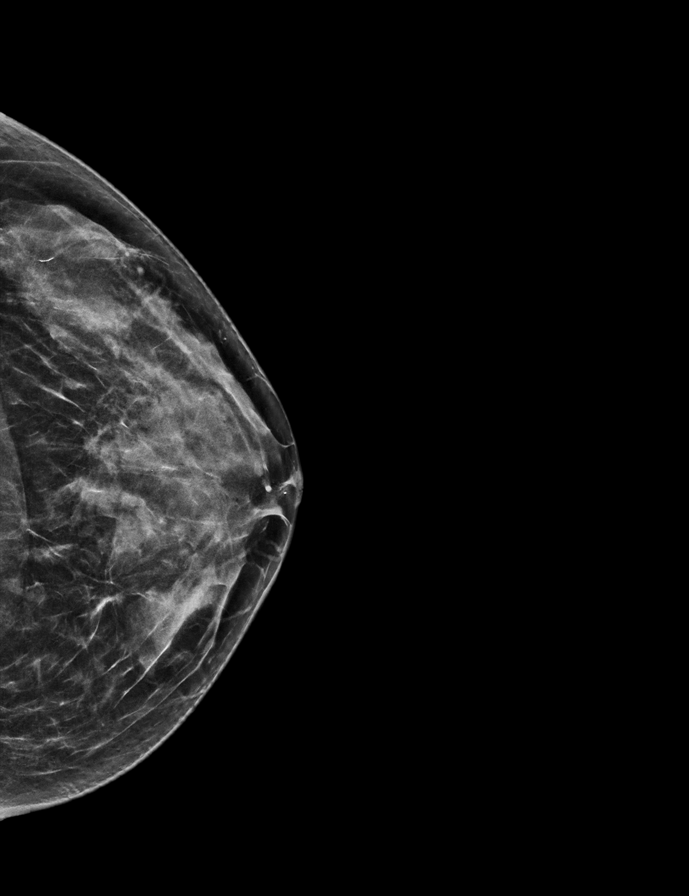

[L MLO synth-2D]
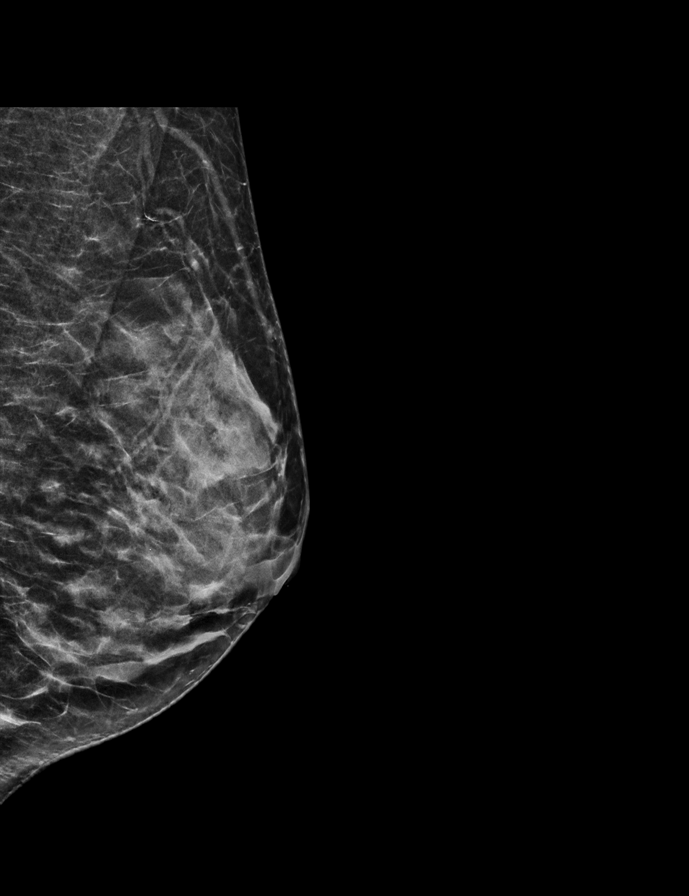

[R MLO synth-2D]
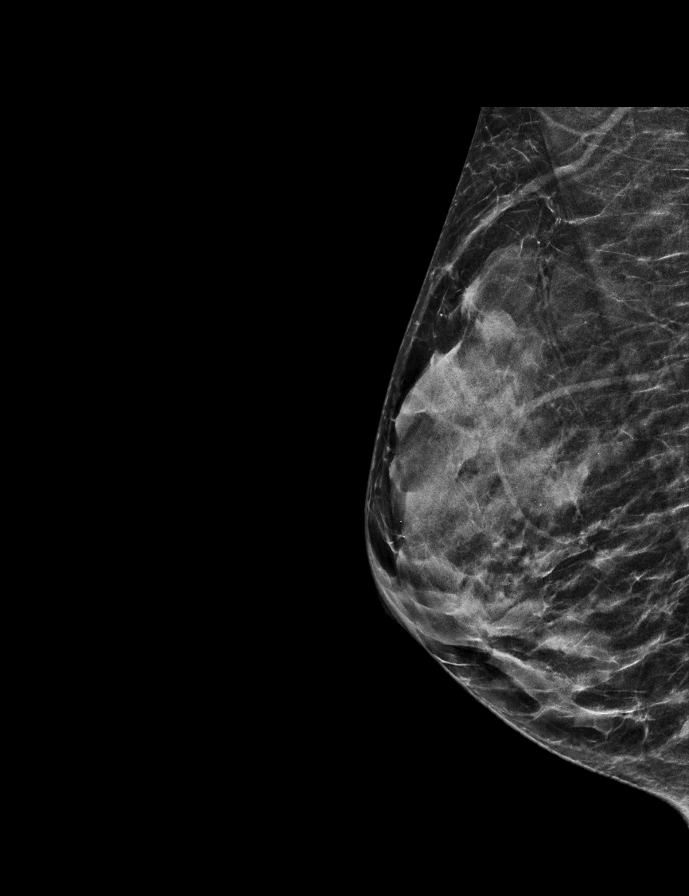

[R CC synth-2D]
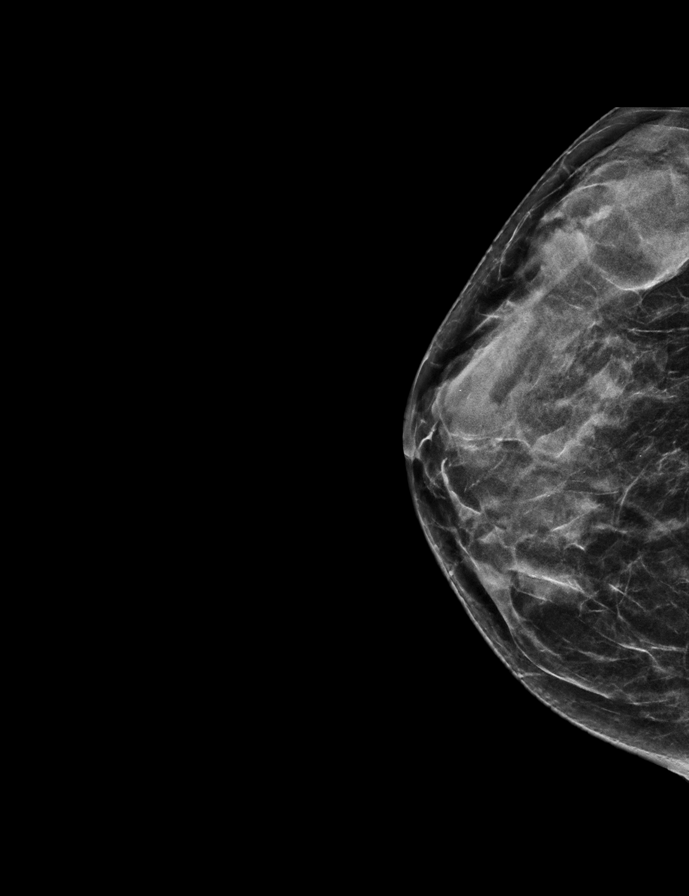

[L MLO tomo · 2 of 47 frames shown]
[frame 16/47]
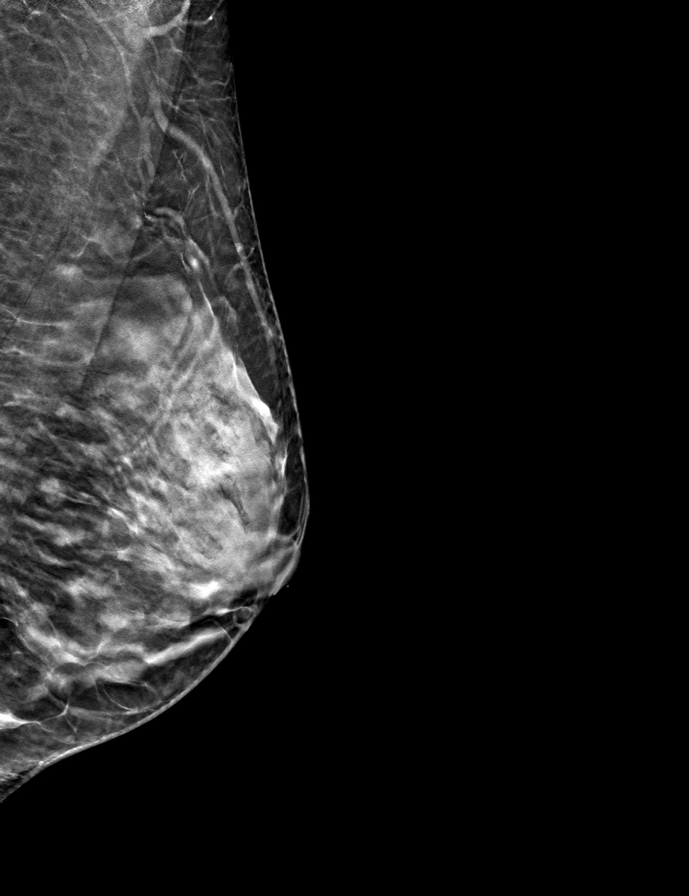
[frame 24/47]
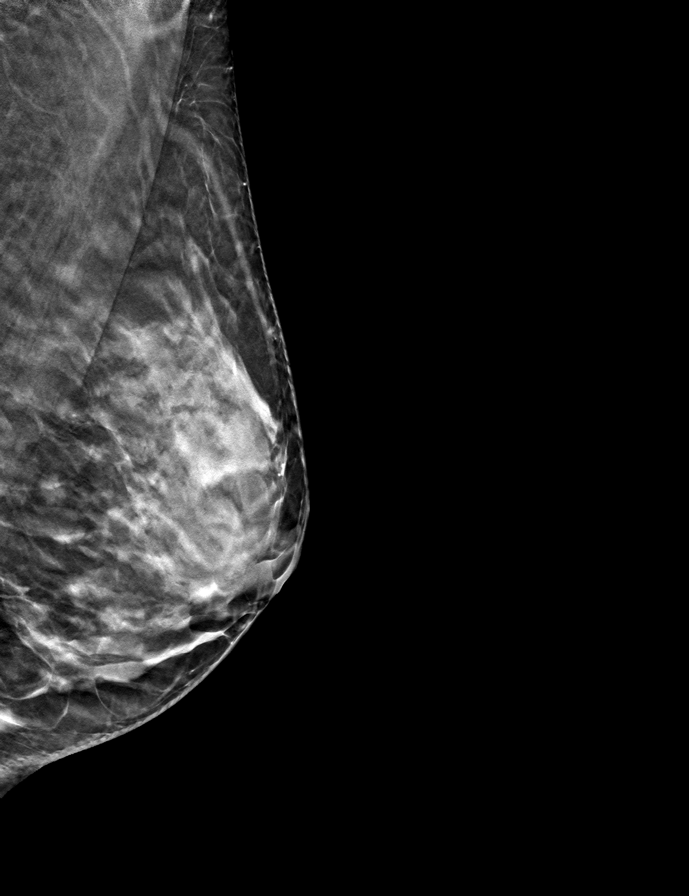

[R MLO tomo · tomo slice 24/47.0]
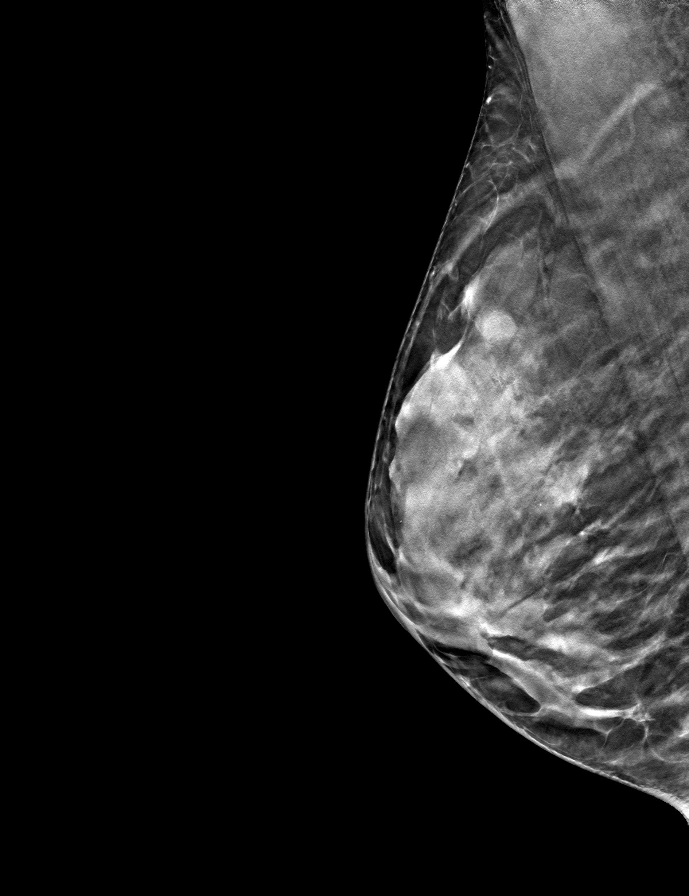

[R CC tomo · tomo slice 25/48.0]
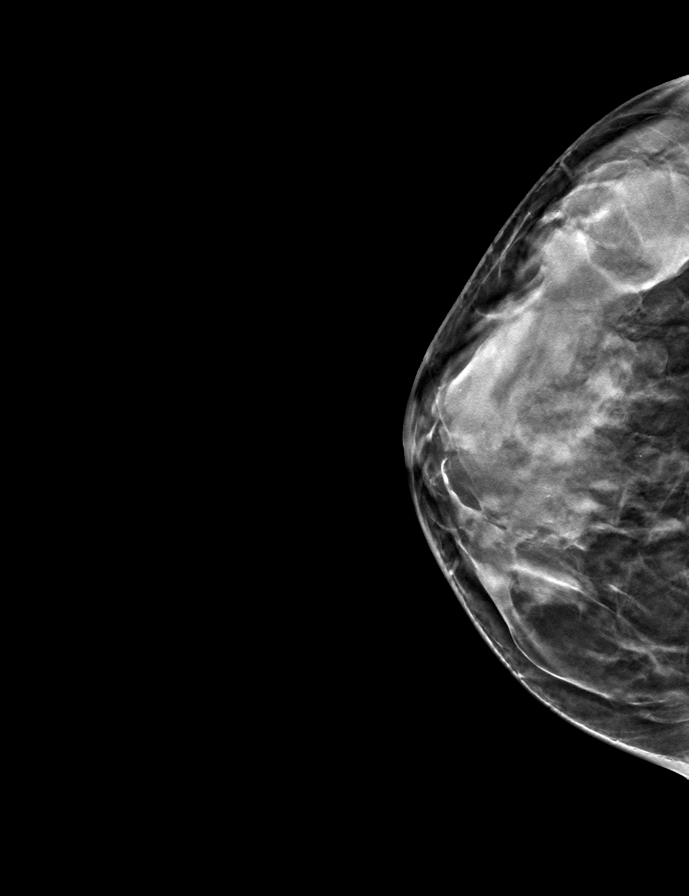

[L CC tomo · tomo slice 25/49.0]
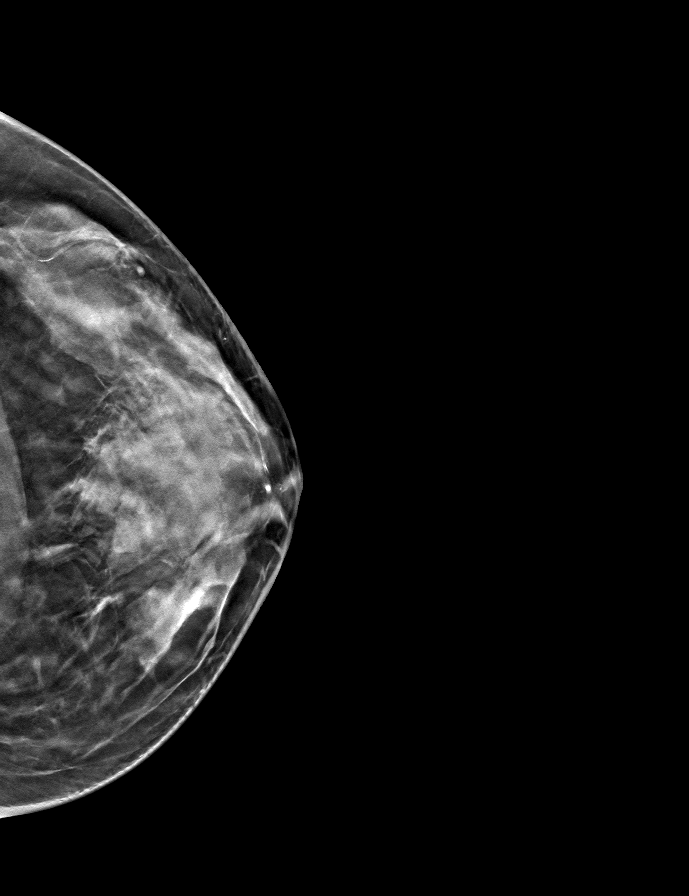

[9 of 24 positions shown; findings below may reference images not displayed]

ACR Breast Density Category c: The breast tissue is heterogeneously
dense, which may obscure small masses.
FINDINGS: In the right breast mass requires further evaluation.

In the left breast mass requires further evaluation.

Images were processed with CAD.
IMPRESSION: Further evaluation is suggested for possible mass in the right
breast.

Further evaluation is suggested for possible mass in the left
breast.

RECOMMENDATION:
Diagnostic mammogram and possibly ultrasound of both breasts.
(Code:CY-[id number])

The patient will be contacted regarding the findings, and additional
imaging will be scheduled.

BI-RADS CATEGORY  0: Incomplete. Need additional imaging evaluation
and/or prior mammograms for comparison.

## 2021-12-29 ENCOUNTER — Other Ambulatory Visit (INDEPENDENT_AMBULATORY_CARE_PROVIDER_SITE_OTHER): Payer: BC Managed Care – PPO

## 2021-12-29 ENCOUNTER — Telehealth: Payer: Self-pay | Admitting: Internal Medicine

## 2021-12-29 ENCOUNTER — Other Ambulatory Visit: Payer: Self-pay

## 2021-12-29 DIAGNOSIS — E890 Postprocedural hypothyroidism: Secondary | ICD-10-CM | POA: Diagnosis not present

## 2021-12-29 LAB — T4, FREE: Free T4: 1.43 ng/dL (ref 0.60–1.60)

## 2021-12-29 LAB — TSH: TSH: 0.13 u[IU]/mL — ABNORMAL LOW (ref 0.35–5.50)

## 2021-12-29 NOTE — Telephone Encounter (Signed)
If she has labs today, lets wait and I will send it as soon as I see them returning.

## 2021-12-29 NOTE — Telephone Encounter (Signed)
MEDICATION: Levothyroxine 75 mcg  PHARMACY:  [facility] in [place]?  yes  IS THIS A 90 DAY SUPPLY : yes  IS PATIENT OUT OF MEDICATION: yes  IF NOT; HOW MUCH IS LEFT: none  LAST APPOINTMENT DATE: @11 /14/2022  NEXT APPOINTMENT DATE:@2 /22/2023  DO WE HAVE YOUR PERMISSION TO LEAVE A DETAILED MESSAGE?:yes  OTHER COMMENTS: patient advised that pharmacy told her to call because of the change in dosage/frequency   **Let patient know to contact pharmacy at the end of the day to make sure medication is ready. **  ** Please notify patient to allow 48-72 hours to process**  **Encourage patient to contact the pharmacy for refills or they can request refills through [facility]**

## 2021-12-30 MED ORDER — LEVOTHYROXINE SODIUM 75 MCG PO TABS: ORAL_TABLET | ORAL | 3 refills | Status: DC

## 2021-12-30 MED ORDER — LEVOTHYROXINE SODIUM 50 MCG PO TABS: 50.0000 ug | ORAL_TABLET | ORAL | 3 refills | Status: DC

## 2022-03-11 ENCOUNTER — Other Ambulatory Visit (INDEPENDENT_AMBULATORY_CARE_PROVIDER_SITE_OTHER): Payer: BC Managed Care – PPO

## 2022-03-11 DIAGNOSIS — E890 Postprocedural hypothyroidism: Secondary | ICD-10-CM

## 2022-03-11 LAB — T4, FREE: Free T4: 1.14 ng/dL (ref 0.60–1.60)

## 2022-03-11 LAB — TSH: TSH: 2.09 u[IU]/mL (ref 0.35–5.50)

## 2022-09-15 ENCOUNTER — Encounter: Payer: Self-pay | Admitting: Internal Medicine

## 2022-09-15 ENCOUNTER — Ambulatory Visit (INDEPENDENT_AMBULATORY_CARE_PROVIDER_SITE_OTHER): Payer: BC Managed Care – PPO | Admitting: Internal Medicine

## 2022-09-15 VITALS — BP 100/68 | Ht 59.5 in | Wt 114.6 lb

## 2022-09-15 DIAGNOSIS — E890 Postprocedural hypothyroidism: Secondary | ICD-10-CM | POA: Diagnosis not present

## 2022-09-15 DIAGNOSIS — C73 Malignant neoplasm of thyroid gland: Secondary | ICD-10-CM

## 2022-09-15 DIAGNOSIS — E559 Vitamin D deficiency, unspecified: Secondary | ICD-10-CM

## 2022-09-15 LAB — VITAMIN D 25 HYDROXY (VIT D DEFICIENCY, FRACTURES): VITD: 31.89 ng/mL (ref 30.00–100.00)

## 2022-09-15 LAB — TSH: TSH: 0.99 u[IU]/mL (ref 0.35–5.50)

## 2022-09-15 LAB — T4, FREE: Free T4: 1.07 ng/dL (ref 0.60–1.60)

## 2022-09-15 NOTE — Patient Instructions (Addendum)
Please continue: - Synthroid 75 mcg daily  Take the thyroid hormone every day, with water, at least 30 minutes before breakfast, separated by at least 4 hours from: - acid reflux medications - calcium - iron - multivitamins  Please restart 1000 units vitamin D.  Continue calcium 1000 mg with a meal.  Please stop at the lab.  Please come back for a follow-up appointment in 1 year.

## 2022-09-15 NOTE — Progress Notes (Signed)
Patient ID: Molly Prince, female   DOB: [birth year], 51 y.o.   MRN: [id number]  HPI  Molly Prince is a 51 y.o.-year-old female, initially referred by Dr. Harlow Asa, for management of thyroid cancer and postsurgical hypothyroidism. Last visit 1 year ago.  Interim history: She denies dysphagia or neck pressure.  Also, no choking or masses felt in the neck. No numbness around her mouth or cramps in her hands.  Papillary thyroid cancer -Diagnosed in 01/2016.  Reviewed her thyroid cancer history: 10/27/2015: Thyroid ultrasound: Right thyroid lobe Measurements: 3.9 x 1.0 x 1.1 cm.  No nodules visualized. Left thyroid lobe Measurements: 3.5 x 1.8 x 1.8 cm. 2.3 x 1.8 x 1.8 cm heterogeneous solid mass within the left thyroid lobe. Smaller adjacent 8 mm solid nodule. Isthmus Thickness: 2 mm in thickness.  No nodules visualized. 11/26/2015: FNA: PTC ([employer]hesda category VI) 01/08/2016: Total thyroidectomy-Final pathology: Diagnosis Thyroid, thyroidectomy - PAPILLARY THYROID CARCINOMA, 2.4 CM. - CARCINOMA IS CONFINED TO THE THYROID. - THE SURGICAL RESECTION MARGINS ARE NEGATIVE FOR CARCINOMA. - SEE ONCOLOGY TABLE BELOW. Microscopic Comment THYROID Specimen: Thyroid. Procedure (including lymph node sampling if applicable): Total thyroidectomy. Specimen Integrity (intact/fragmented): Intact. Tumor focality: Unifocal Dominant tumor: Maximum tumor size (cm): 2.4 cm (gross measurement). Tumor laterality: Left lobe. Histologic type (including subtype and/or unique features as applicable): Papillary thyroid carcinoma. Tumor capsule: Not present. Extrathyroidal extension: Not identified. Capsular invasion with degree of invasion if present: N/A Margins: Negative for carcinoma. Lymphatic or vascular invasion: Not identified. Lymph nodes: # examined - 0; # positive; N/A Extracapsular extension (if applicable): Not identified. TNM code: pT2, pNX Non-neoplastic thyroid: No significant findings.  (JBK:gt, 01/11/16) 06/30/2016: RAI treatment 72 mCi 07/08/2016: Post treatment whole-body scan: negative for metastases 09/12/2017: Neck ultrasound: No residual/ recurrent tissue post thyroidectomy. 09/10/2021: Neck ultrasound: No residual/ recurrent tissue post thyroidectomy.  Thyroglobulin remains at the lower limit of detection, while ATA antibodies are not detectable: Lab Results  Component Value Date   THYROGLB 0.1 (L) 09/08/2021   THYROGLB 0.1 (L) 10/16/2020   THYROGLB 0.1 (L) 10/16/2019   THYROGLB 0.1 (L) 08/31/2018   THYROGLB 0.1 (L) 09/05/2017   THYROGLB 0.1 (L) 03/07/2017   THYROGLB 0.2 (L) 09/07/2016   THGAB <1 09/08/2021   THGAB <1 10/16/2020   THGAB <1 10/16/2019   THGAB <1 08/31/2018   THGAB <1 09/05/2017   THGAB <1 03/07/2017   THGAB <1 09/07/2016   Postsurgical hypothyroidism  Pt is on LT4 75 mcg daily: - in am - fasting - at least 3h from b'fast - + calcium at night - no iron - stopped multivitamins at night  - no PPIs - not on Biotin  Reviewed her TFTs: Lab Results  Component Value Date   TSH 2.09 03/11/2022   TSH 0.13 (L) 12/29/2021   TSH 0.25 (L) 09/21/2021   TSH 0.56 09/08/2021   TSH 0.37 03/05/2021   TSH 0.08 (L) 01/12/2021   TSH 0.11 (L) 11/25/2020   TSH 2.15 10/16/2020   TSH 3.18 06/25/2020   TSH 4.15 01/15/2020   FREET4 1.14 03/11/2022   FREET4 1.43 12/29/2021   FREET4 1.09 09/08/2021   FREET4 1.12 03/05/2021   FREET4 1.24 01/12/2021   FREET4 1.19 11/25/2020   FREET4 1.01 10/16/2020   FREET4 0.93 01/15/2020   FREET4 1.07 10/16/2019   FREET4 0.89 08/22/2019    No FH of thyroid cancer. No h/o radiation tx to head or neck except for RAI treatment.. No herbal supplements. No Biotin use. No recent  steroids use.   Calcium levels reviewed: Component     Latest Ref Rng & Units 09/08/2021  Calcium Ionized     4.8 - 5.6 mg/dL 4.64 (L)   Lab Results  Component Value Date   CALCIUM 8.6 09/21/2021   CALCIUM 8.5 (L) 10/20/2020   CALCIUM  8.5 (L) 06/25/2020   CALCIUM 9.0 06/25/2019   CALCIUM 8.9 06/22/2018   CALCIUM 9.0 09/05/2017   CALCIUM 8.4 (L) 02/02/2017   CALCIUM 8.8 09/07/2016   CALCIUM 7.8 (L) 01/09/2016   CALCIUM 8.8 (L) 01/01/2016  03/05/2021: iCa 4.7 (4.5 - 5.6 mg/dL)  + Vitamin D deficiency:: Lab Results  Component Value Date   VD25OH 33.90 09/08/2021   VD25OH 36.9 03/05/2021   VD25OH 48 10/20/2020   VD25OH 19 (L) 06/25/2020   VD25OH 35 06/25/2019   VD25OH 33 06/22/2018   VD25OH 30.43 09/05/2017   VD25OH 30.10 03/07/2016   She was on ergocalciferol 50,000 units weekly >> now 1000 units - inconsistently. On calcium 1000 mg daily.  ROS: + see HPI  I reviewed pt's medications, allergies, PMH, social hx, family hx, and changes were documented in the history of present illness. Otherwise, unchanged from my initial visit note.  Past Medical History:  Diagnosis Date   Cancer [facility])    papillary thyroid carcinoma    Past Surgical History:  Procedure Laterality Date   THYROIDECTOMY N/A 01/08/2016   Procedure: TOTAL THYROIDECTOMY;  Surgeon: Armandina Gemma, MD;  Location: [facility];  Service: General;  Laterality: N/A;   Social History   Social History   Marital Status: married    Spouse Name: N/A   Number of Children: 3   Occupational History   n/a   Social History Main Topics   Smoking status: Never Smoker    Smokeless tobacco: Not on file   Alcohol Use: 0.0 oz/week    0 Standard drinks or equivalent per week     Comment: social - [place]   Drug Use: No   Sexual Activity: Yes     Comment: IUD- MIRENA   Social History Narrative   Age of menarche: 21 yrs   Gravida: 4 pregnancies                  3 children      Current Outpatient Medications on File Prior to Visit  Medication Sig Dispense Refill   levonorgestrel (MIRENA) 20 MCG/24HR IUD 1 each by Intrauterine route once.     levothyroxine (SYNTHROID) 50 MCG tablet Take 1 tablet (50 mcg total) by mouth every other day. Alternating with 75 mcg  every other day. 45 tablet 3   levothyroxine (SYNTHROID) 75 MCG tablet Take 75 mcg by mouth every other day, alternating with 50 mcg every other day. 45 tablet 3   VITAMIN D PO Take by mouth.     No current facility-administered medications on file prior to visit.   No Known Allergies Family History  Problem Relation Age of Onset   Hypertension Mother    PE: BP 100/68 (BP Location: Right Arm, Patient Position: Sitting, Cuff Size: Normal)   Ht 4' 11.5" (1.511 m)   Wt 114 lb 9.6 oz (52 kg)   BMI 22.76 kg/m  Wt Readings from Last 3 Encounters:  09/15/22 114 lb 9.6 oz (52 kg)  09/21/21 109 lb (49.4 kg)  09/08/21 111 lb (50.3 kg)   Constitutional: normal weight, in NAD Eyes:  EOMI, no exophthalmos ENT: no neck masses, no cervical lymphadenopathy Cardiovascular: RRR, No  MRG Respiratory: CTA B Musculoskeletal: no deformities Skin:no rashes - dark macular spots on R side of her cervical scar:  Neurological: no tremor with outstretched hands  ASSESSMENT: 1. Thyroid cancer - see HPI  2. Postsurgical Hypothyroidism  3.  Postsurgical hypocalcemia  PLAN:  1. Thyroid cancer - papillary -We discussed that she has very good prognosis and her thyroid cancer is unlikely to affect her life expectancy or quality of life -Her tumor was larger than 1.5 cm so we proceeded with RAI treatment for postop thyroid remnant ablation.  We discussed at that time that the main role of this is to facilitate monitoring in the long run, by checking thyroglobulin.  She had RAI with Thyrogen in 06/2016.  Posttreatment whole-body scan was negative for metastasis or extension into the neck.  We checked another neck ultrasound at the end of 2018 and this was negative for abnormal masses.  We repeated an ultrasound after last visit and this did not show any abnormalities in the neck. -At today's visit, she has 2 dark spots on the right side of her thyroidectomy scar.  Since this was unusual, I took a picture and  showed him to her.  She remembers that she actually put permanent make-up to disguise the scar and give the appearance of 2 moles -Latest thyroglobulin level was stable, detectable, at the lower limit of detection.  Antithyroglobulin antibodies were undetectable. -We will recheck these today  2. Patient with history of total thyroidectomy for thyroid cancer, now with iatrogenic hypothyroidism - latest thyroid labs reviewed with pt. >> normal: Lab Results  Component Value Date   TSH 2.09 03/11/2022  - she continues on LT4 75 mcg daily - pt feels good on this dose. - we discussed about taking the thyroid hormone every day, with water, >30 minutes before breakfast, separated by >4 hours from acid reflux medications, calcium, iron, multivitamins. Pt. is taking it correctly. - will check thyroid tests today: TSH and fT4 - If labs are abnormal, she will need to return for repeat TFTs in 1.5 months  3.  Postsurgical hypocalcemia -Latest calcium level was only slightly low, at 4.64 (4.8-5.6) at last visit.  I advised her to take 1 tablet of calcium daily consistently. -No acral cramping or perioral numbness -We will recheck her ionized calcium today  4.  Vitamin D deficiency: -Vitamin D was low, 19, in 06/2020 -She started vitamin D supplement at that time: Ergocalciferol 50,000 units once a week per OB/GYN in the past, but at last visit she was on 1000 units vitamin D3 daily.  We continued this.  She has not taken it consistently, though. -Latest vitamin D level was normal: Lab Results  Component Value Date   VD25OH 33.90 09/08/2021  -We will repeat the level today  Needs refills.  Component     Latest Ref Rng 09/15/2022  T4,Free(Direct)     0.60 - 1.60 ng/dL 1.07   TSH     0.35 - 5.50 uIU/mL 0.99   Thyroglobulin     ng/mL 0.1 (L)   Comment --   Thyroglobulin Ab     < or = 1 IU/mL <1   Calcium Ionized     4.7 - 5.5 mg/dL 4.7   VITD     30.00 - 100.00 ng/mL 31.89     Labs  normal, with stable thyroglobulin.  Philemon Kingdom, MD PhD [facility]

## 2022-09-16 LAB — THYROGLOBULIN LEVEL: Thyroglobulin: 0.1 ng/mL — ABNORMAL LOW

## 2022-09-16 LAB — CALCIUM, IONIZED: Calcium, Ion: 4.7 mg/dL (ref 4.7–5.5)

## 2022-09-16 LAB — THYROGLOBULIN ANTIBODY: Thyroglobulin Ab: <1 IU/mL (ref <=1)

## 2022-09-16 MED ORDER — LEVOTHYROXINE SODIUM 75 MCG PO TABS: ORAL_TABLET | ORAL | 3 refills | Status: DC

## 2022-09-28 ENCOUNTER — Encounter: Payer: Self-pay | Admitting: Obstetrics & Gynecology

## 2022-09-28 ENCOUNTER — Ambulatory Visit (INDEPENDENT_AMBULATORY_CARE_PROVIDER_SITE_OTHER): Payer: BC Managed Care – PPO | Admitting: Obstetrics & Gynecology

## 2022-09-28 VITALS — Ht 59.5 in | Wt 108.0 lb

## 2022-09-28 DIAGNOSIS — Z01419 Encounter for gynecological examination (general) (routine) without abnormal findings: Secondary | ICD-10-CM

## 2022-09-28 DIAGNOSIS — Z30431 Encounter for routine checking of intrauterine contraceptive device: Secondary | ICD-10-CM

## 2022-09-28 NOTE — Progress Notes (Signed)
Molly Prince 1970/12/08 [id number]   History:    51 y.o.  G5P3A2L3 Married.  Children: 53 yo daughter in [place], will move here in 11/2022, 51 yo and 52 yo.   RP:  Established patient presenting for annual gyn exam    HPI: Well on Mirena IUD x 09/2020.  No menses, no BTB.  No pelvic pain.  No pain with IC.  Pap Neg 09/2021.  Will repeat Pap at 2-3 years.  Urine mild SUI.  Kegels recommended.  BMs normal. Breasts wnl, no pain and no lumps currently.  Mammo Neg 08/2021, will schedule now. BMI 21.45. Physically active, walking and swimming/healthy nutrition. Fasting health labs here today.  History of thyroidectomy for thyroid cancer on Synthroid, followed by Dr Cruzita Lederer.  Fasting labs here today. Recommend scheduling a Colono. Flu vaccine declined today.  Past medical history,surgical history, family history and social history were all reviewed and documented in the EPIC chart.  Gynecologic History No LMP recorded. (Menstrual status: IUD).  Obstetric History OB History  Gravida Para Term Preterm AB Living  _0 SAB IAB Ectopic Multiple Live Births  2            # Outcome Date GA Lbr Len/2nd Weight Sex Delivery Anes PTL Lv  5 SAB           4 SAB           3 Term           2 Term           1 Term             ROS: A ROS was performed and pertinent positives and negatives are included in the history. GENERAL: No fevers or chills. HEENT: No change in vision, no earache, sore throat or sinus congestion. NECK: No pain or stiffness. CARDIOVASCULAR: No chest pain or pressure. No palpitations. PULMONARY: No shortness of breath, cough or wheeze. GASTROINTESTINAL: No abdominal pain, nausea, vomiting or diarrhea, melena or bright red blood per rectum. GENITOURINARY: No urinary frequency, urgency, hesitancy or dysuria. MUSCULOSKELETAL: No joint or muscle pain, no back pain, no recent trauma. DERMATOLOGIC: No rash, no itching, no lesions. ENDOCRINE: No polyuria, polydipsia, no heat or  cold intolerance. No recent change in weight. HEMATOLOGICAL: No anemia or easy bruising or bleeding. NEUROLOGIC: No headache, seizures, numbness, tingling or weakness. PSYCHIATRIC: No depression, no loss of interest in normal activity or change in sleep pattern.     Exam:   Ht 4' 11.5" (1.511 m)   Wt 108 lb (49 kg)   BMI 21.45 kg/m   Body mass index is 21.45 kg/m.  General appearance : Well developed well nourished female. No acute distress HEENT: Eyes: no retinal hemorrhage or exudates,  Neck supple, trachea midline, no carotid bruits, no thyroidmegaly Lungs: Clear to auscultation, no rhonchi or wheezes, or rib retractions  Heart: Regular rate and rhythm, no murmurs or gallops Breast:Examined in sitting and supine position were symmetrical in appearance, no palpable masses or tenderness,  no skin retraction, no nipple inversion, no nipple discharge, no skin discoloration, no axillary or supraclavicular lymphadenopathy Abdomen: no palpable masses or tenderness, no rebound or guarding Extremities: no edema or skin discoloration or tenderness  Pelvic: Vulva: Normal             Vagina: No gross lesions or discharge  Cervix: No gross lesions or discharge.  IUD strings felt at [facility].  Uterus  AV, normal size, shape and consistency, non-tender and mobile  Adnexa  Without masses or tenderness  Anus: Normal   Assessment/Plan:  51 y.o. female for annual exam   1. Well female exam with routine gynecological exam Well on Mirena IUD x 09/2020.  No menses, no BTB.  No pelvic pain. No pain with IC.  Pap Neg 09/2021.  Will repeat Pap at 2-3 years.  Urine mild SUI.  Kegels recommended.  BMs normal. Breasts wnl, no pain and no lumps currently.  Mammo Neg 08/2021, will schedule now. BMI 21.45. Physically active, walking and swimming/healthy nutrition. Fasting health labs here today.  History of thyroidectomy for thyroid cancer on Synthroid, followed by Dr Cruzita Lederer.  Fasting labs here today. Recommend  scheduling a Colono. Flu vaccine declined today. - Comp Met (CMET) - Lipid Profile - CBC  2. Encounter for routine checking of intrauterine contraceptive device (IUD) Well on Mirena IUD x 09/2020.  No menses, no BTB.  No pelvic pain. No pain with IC. IUD in good location.  Other orders - levothyroxine (SYNTHROID) 50 MCG tablet; Take 50 mcg by mouth every other day.   [name] MD, 10:14 AM 09/28/2022

## 2022-09-29 LAB — COMPREHENSIVE METABOLIC PANEL
AG Ratio: 1.6 (calc) (ref 1.0–2.5)
ALT: 12 U/L (ref 6–29)
AST: 16 U/L (ref 10–35)
Albumin: 4.5 g/dL (ref 3.6–5.1)
Alkaline phosphatase (APISO): 59 U/L (ref 37–153)
BUN: 14 mg/dL (ref 7–25)
CO2: 27 mmol/L (ref 20–32)
Calcium: 9.2 mg/dL (ref 8.6–10.4)
Chloride: 103 mmol/L (ref 98–110)
Creat: 0.76 mg/dL (ref 0.50–1.03)
Globulin: 2.9 g/dL (calc) (ref 1.9–3.7)
Glucose, Bld: 81 mg/dL (ref 65–99)
Potassium: 4.9 mmol/L (ref 3.5–5.3)
Sodium: 139 mmol/L (ref 135–146)
Total Bilirubin: 0.4 mg/dL (ref 0.2–1.2)
Total Protein: 7.4 g/dL (ref 6.1–8.1)

## 2022-09-29 LAB — LIPID PANEL
Cholesterol: 215 mg/dL — ABNORMAL HIGH (ref <200)
HDL: 59 mg/dL (ref >=50)
LDL Cholesterol (Calc): 142 mg/dL (calc) — ABNORMAL HIGH
Non-HDL Cholesterol (Calc): 156 mg/dL (calc) — ABNORMAL HIGH (ref <130)
Total CHOL/HDL Ratio: 3.6 (calc) (ref <5.0)
Triglycerides: 52 mg/dL (ref <150)

## 2022-09-29 LAB — CBC
HCT: 40.1 % (ref 35.0–45.0)
Hemoglobin: 13.6 g/dL (ref 11.7–15.5)
MCH: 29.8 pg (ref 27.0–33.0)
MCHC: 33.9 g/dL (ref 32.0–36.0)
MCV: 87.7 fL (ref 80.0–100.0)
MPV: 11.5 fL (ref 7.5–12.5)
Platelets: 245 10*3/uL (ref 140–400)
RBC: 4.57 10*6/uL (ref 3.80–5.10)
RDW: 13 % (ref 11.0–15.0)
WBC: 6.4 10*3/uL (ref 3.8–10.8)

## 2022-10-25 ENCOUNTER — Telehealth: Payer: Self-pay | Admitting: Internal Medicine

## 2022-10-25 DIAGNOSIS — E890 Postprocedural hypothyroidism: Secondary | ICD-10-CM

## 2022-10-25 MED ORDER — LEVOTHYROXINE SODIUM 50 MCG PO TABS: 50.0000 ug | ORAL_TABLET | ORAL | 1 refills | Status: DC

## 2022-10-25 MED ORDER — LEVOTHYROXINE SODIUM 75 MCG PO TABS: 75.0000 ug | ORAL_TABLET | ORAL | 1 refills | Status: DC

## 2022-10-25 NOTE — Addendum Note (Signed)
Addended by: Lauralyn Primes on: 10/25/2022 03:05 PM   Modules accepted: Orders

## 2022-10-25 NOTE — Telephone Encounter (Signed)
Patient is calling to check on the correct dosage of levothyroxine.  Patient states that she was taking  levothyroxine levothyroxine (SYNTHROID) 75 MCG tablet one day and then levothyroxine levothyroxine (SYNTHROID) 50 MCG tablet the next day and alternating those.  When she went to the pharmacy after her visit with Dr. Cruzita Lederer the pharmacy is telling her that she is no linger taking the 50 MCG only the 63 MCG.

## 2022-10-25 NOTE — Telephone Encounter (Signed)
Lvm for pt advising if she has been alternating Levothyroxine 75 mcg and 50 mcg then that is what she should continue. Rx sent to pharmacy.

## 2022-10-25 NOTE — Telephone Encounter (Signed)
T, she is correct -my impression at last visit was that she was taking just the 75 mcg dose daily.  However, if she is alternating doses, in the light of the thyroid tests that we obtained recently, which were at goal, she should definitely continue to do so.

## 2022-11-07 ENCOUNTER — Encounter (HOSPITAL_BASED_OUTPATIENT_CLINIC_OR_DEPARTMENT_OTHER): Payer: Self-pay | Admitting: *Deleted

## 2022-11-07 ENCOUNTER — Other Ambulatory Visit: Payer: Self-pay

## 2022-11-07 ENCOUNTER — Emergency Department (HOSPITAL_BASED_OUTPATIENT_CLINIC_OR_DEPARTMENT_OTHER)
Admission: EM | Admit: 2022-11-07 | Discharge: 2022-11-07 | Discharge status: Against Medical Advice | Payer: BC Managed Care – PPO | Attending: Emergency Medicine | Admitting: Emergency Medicine

## 2022-11-07 DIAGNOSIS — R5383 Other fatigue: Secondary | ICD-10-CM | POA: Diagnosis not present

## 2022-11-07 DIAGNOSIS — Z5321 Procedure and treatment not carried out due to patient leaving prior to being seen by health care provider: Secondary | ICD-10-CM | POA: Insufficient documentation

## 2022-11-07 DIAGNOSIS — J101 Influenza due to other identified influenza virus with other respiratory manifestations: Secondary | ICD-10-CM | POA: Insufficient documentation

## 2022-11-07 DIAGNOSIS — Z1152 Encounter for screening for COVID-19: Secondary | ICD-10-CM | POA: Diagnosis not present

## 2022-11-07 LAB — RESP PANEL BY RT-PCR (RSV, FLU A&B, COVID)  RVPGX2
Influenza A by PCR: NEGATIVE
Influenza B by PCR: POSITIVE — AB
Resp Syncytial Virus by PCR: NEGATIVE
SARS Coronavirus 2 by RT PCR: NEGATIVE

## 2022-11-07 NOTE — ED Triage Notes (Signed)
Pt has been having back pain and body aches and fatigue and congestion at night for 5 days.  Her daughter had the same symptoms.

## 2022-11-07 NOTE — ED Provider Notes (Signed)
Patient eloped prior to my evaluation.  She got her COVID swab sent off and then left.  Anticipate her following her MyChart for the results of her viral swab given her reported URI symptoms.   Allis Quirarte, Gwenyth Allegra, MD 11/07/22 [phone]

## 2022-11-07 NOTE — ED Notes (Addendum)
Pt not in room. Reported: Previously mentioned "just wanting results", left after being swabbed. Seen walking out. Alert, NAD, calm, interactive, steady gait.

## 2022-11-08 ENCOUNTER — Encounter: Payer: Self-pay | Admitting: Obstetrics & Gynecology

## 2022-12-15 ENCOUNTER — Other Ambulatory Visit: Payer: Self-pay | Admitting: Internal Medicine

## 2022-12-15 DIAGNOSIS — E890 Postprocedural hypothyroidism: Secondary | ICD-10-CM

## 2023-01-02 ENCOUNTER — Encounter: Payer: Self-pay | Admitting: Obstetrics & Gynecology

## 2023-01-02 ENCOUNTER — Ambulatory Visit (INDEPENDENT_AMBULATORY_CARE_PROVIDER_SITE_OTHER): Payer: BC Managed Care – PPO | Admitting: Obstetrics & Gynecology

## 2023-01-02 VITALS — BP 116/70 | HR 74

## 2023-01-02 DIAGNOSIS — Z975 Presence of (intrauterine) contraceptive device: Secondary | ICD-10-CM

## 2023-01-02 DIAGNOSIS — E890 Postprocedural hypothyroidism: Secondary | ICD-10-CM | POA: Diagnosis not present

## 2023-01-02 DIAGNOSIS — Z23 Encounter for immunization: Secondary | ICD-10-CM

## 2023-01-02 DIAGNOSIS — N951 Menopausal and female climacteric states: Secondary | ICD-10-CM | POA: Diagnosis not present

## 2023-01-02 NOTE — Progress Notes (Signed)
    Molly Prince 1971/01/26 [id number]        51 y.o.  [id number]   RP: Hot flushes x 2 months  HPI: Hot flushes x 2 months.  No menses on the Mirena IUD x 09/2020.  S/P Thyroidectomy on Synthroid.   OB History  Gravida Para Term Preterm AB Living  5 3 3 $ 0 2    SAB IAB Ectopic Multiple Live Births  2 0 0        # Outcome Date GA Lbr Len/2nd Weight Sex Delivery Anes PTL Lv  5 SAB           4 SAB           3 Term           2 Term           1 Term             Past medical history,surgical history, problem list, medications, allergies, family history and social history were all reviewed and documented in the EPIC chart.   Directed ROS with pertinent positives and negatives documented in the history of present illness/assessment and plan.  Exam:  Vitals:   01/02/23 1342  BP: 116/70  Pulse: 74  SpO2: 97%   General appearance:  Normal  Abdomen: ***  Gynecologic exam: ***   Assessment/Plan:  52 y.o. G5P3020   1. Hot flushes, perimenopausal Will try Ashwagandha. - [place]  2. Postsurgical hypothyroidism On Synthroid. - TSH  3. IUD (intrauterine device) in place Mirena IUD x 09/2020.  4. Flu vaccine need    [name] MD, 2:13 PM 01/02/2023

## 2023-01-03 LAB — FOLLICLE STIMULATING HORMONE: FSH: 84 m[IU]/mL

## 2023-01-03 LAB — TSH: TSH: 1.06 mIU/L

## 2023-01-04 ENCOUNTER — Encounter: Payer: Self-pay | Admitting: Obstetrics & Gynecology

## 2023-03-30 ENCOUNTER — Encounter: Payer: Self-pay | Admitting: Obstetrics & Gynecology

## 2023-03-30 ENCOUNTER — Telehealth: Payer: Self-pay

## 2023-03-30 ENCOUNTER — Ambulatory Visit (INDEPENDENT_AMBULATORY_CARE_PROVIDER_SITE_OTHER): Payer: BC Managed Care – PPO | Admitting: Obstetrics & Gynecology

## 2023-03-30 VITALS — BP 110/62

## 2023-03-30 DIAGNOSIS — E890 Postprocedural hypothyroidism: Secondary | ICD-10-CM | POA: Diagnosis not present

## 2023-03-30 DIAGNOSIS — N644 Mastodynia: Secondary | ICD-10-CM

## 2023-03-30 DIAGNOSIS — Z0000 Encounter for general adult medical examination without abnormal findings: Secondary | ICD-10-CM | POA: Diagnosis not present

## 2023-03-30 DIAGNOSIS — E785 Hyperlipidemia, unspecified: Secondary | ICD-10-CM | POA: Diagnosis not present

## 2023-03-30 DIAGNOSIS — E559 Vitamin D deficiency, unspecified: Secondary | ICD-10-CM | POA: Diagnosis not present

## 2023-03-30 LAB — CBC
MCV: 88.6 fL (ref 80.0–100.0)
Platelets: 280 10*3/uL (ref 140–400)
RBC: 4.37 10*6/uL (ref 3.80–5.10)
WBC: 6.5 10*3/uL (ref 3.8–10.8)

## 2023-03-30 NOTE — Telephone Encounter (Signed)
-----   Message from Genia Del, MD sent at 03/30/2023 10:08 AM EDT ----- Regarding: Left Dx mammo/US and Right screening mammo Left breast weight/mass feeling while running.  Normal bilateral breast exam.

## 2023-03-30 NOTE — Progress Notes (Signed)
    Molly Prince [birth year] [id number]        52 y.o.  [id number]   RP: Lt breast weight/mass sensation and Health Labs  HPI: Lt breast felt different while running recently, mass moving?  Ran again since then and the sensation is normal again.  Fasting for Health Labs.   OB History  Gravida Para Term Preterm AB Living  5 3 3  0 2    SAB IAB Ectopic Multiple Live Births  2 0 0        # Outcome Date GA Lbr Len/2nd Weight Sex Delivery Anes PTL Lv  5 SAB           4 SAB           3 Term           2 Term           1 Term             Past medical history,surgical history, problem list, medications, allergies, family history and social history were all reviewed and documented in the EPIC chart.   Directed ROS with pertinent positives and negatives documented in the history of present illness/assessment and plan.  Exam:  Vitals:   03/30/23 0954  BP: 110/62   General appearance:  Normal  Breast exam:  Rt and Lt breasts normal.  No nodule or mass felt.  NT.  Normal skin.  Nipples normal, no discharge.  Bilateral axillae normal.   Assessment/Plan:  52 y.o. G5P3020   1. Left Breast pain/mass moving while running Lt breast felt different while running recently, mass moving?  Ran again since then and the sensation is normal again. Normal bilateral breast exam today.  Will schedule Lt Dx mammo/US and Rt screening mammo.   2. Health maintenance examination Fasting for Health Labs. - CBC - Comp Met (CMET) - Lipid Profile - TSH - Vitamin D (25 hydroxy)   [name], 10:00 AM 03/30/2023

## 2023-03-31 LAB — COMPREHENSIVE METABOLIC PANEL
AG Ratio: 1.7 (calc) (ref 1.0–2.5)
ALT: 13 U/L (ref 6–29)
AST: 16 U/L (ref 10–35)
Albumin: 4.3 g/dL (ref 3.6–5.1)
Alkaline phosphatase (APISO): 77 U/L (ref 37–153)
BUN: 14 mg/dL (ref 7–25)
CO2: 27 mmol/L (ref 20–32)
Calcium: 8.9 mg/dL (ref 8.6–10.4)
Chloride: 105 mmol/L (ref 98–110)
Creat: 0.67 mg/dL (ref 0.50–1.03)
Globulin: 2.5 g/dL (calc) (ref 1.9–3.7)
Glucose, Bld: 75 mg/dL (ref 65–99)
Potassium: 5 mmol/L (ref 3.5–5.3)
Sodium: 141 mmol/L (ref 135–146)
Total Bilirubin: 0.5 mg/dL (ref 0.2–1.2)
Total Protein: 6.8 g/dL (ref 6.1–8.1)

## 2023-03-31 LAB — LIPID PANEL
Cholesterol: 211 mg/dL — ABNORMAL HIGH (ref <200)
HDL: 71 mg/dL (ref >=50)
LDL Cholesterol (Calc): 127 mg/dL (calc) — ABNORMAL HIGH
Non-HDL Cholesterol (Calc): 140 mg/dL (calc) — ABNORMAL HIGH (ref <130)
Total CHOL/HDL Ratio: 3 (calc) (ref <5.0)
Triglycerides: 42 mg/dL (ref <150)

## 2023-03-31 LAB — CBC
HCT: 38.7 % (ref 35.0–45.0)
Hemoglobin: 12.8 g/dL (ref 11.7–15.5)
MCH: 29.3 pg (ref 27.0–33.0)
MCHC: 33.1 g/dL (ref 32.0–36.0)
MPV: 11.2 fL (ref 7.5–12.5)
RDW: 13.7 % (ref 11.0–15.0)

## 2023-03-31 LAB — VITAMIN D 25 HYDROXY (VIT D DEFICIENCY, FRACTURES): Vit D, 25-Hydroxy: 47 ng/mL (ref 30–100)

## 2023-03-31 LAB — TSH: TSH: 2.08 mIU/L

## 2023-03-31 NOTE — Telephone Encounter (Signed)
Fayne Norrie with at [facility].  Patient scheduled for Bilateral Dx MMG and L BR [place] on 04/19/23 at 1250.   Spoke with patient, declines interpreter, advised as seen above. Patient verbalizes understanding and is agreeable.   Routing to provider for final review. Patient is agreeable to disposition. Will close encounter.

## 2023-04-19 ENCOUNTER — Ambulatory Visit
Admission: RE | Admit: 2023-04-19 | Discharge: 2023-04-19 | Disposition: A | Discharge status: Home / Self Care | Payer: BC Managed Care – PPO | Source: Ambulatory Visit | Attending: Obstetrics & Gynecology | Admitting: Obstetrics & Gynecology

## 2023-04-19 ENCOUNTER — Ambulatory Visit: Admission: RE | Admit: 2023-04-19 | Payer: BC Managed Care – PPO | Source: Ambulatory Visit

## 2023-04-19 DIAGNOSIS — N644 Mastodynia: Secondary | ICD-10-CM | POA: Diagnosis not present

## 2023-09-20 ENCOUNTER — Encounter: Payer: Self-pay | Admitting: Internal Medicine

## 2023-09-20 ENCOUNTER — Ambulatory Visit (INDEPENDENT_AMBULATORY_CARE_PROVIDER_SITE_OTHER): Payer: Self-pay | Admitting: Internal Medicine

## 2023-09-20 VITALS — BP 102/64 | HR 62 | Ht 59.5 in | Wt 112.2 lb

## 2023-09-20 DIAGNOSIS — C73 Malignant neoplasm of thyroid gland: Secondary | ICD-10-CM

## 2023-09-20 DIAGNOSIS — E890 Postprocedural hypothyroidism: Secondary | ICD-10-CM

## 2023-09-20 DIAGNOSIS — E559 Vitamin D deficiency, unspecified: Secondary | ICD-10-CM

## 2023-09-20 LAB — TSH: TSH: 1.92 u[IU]/mL (ref 0.35–5.50)

## 2023-09-20 LAB — VITAMIN D 25 HYDROXY (VIT D DEFICIENCY, FRACTURES): VITD: 51.7 ng/mL (ref 30.00–100.00)

## 2023-09-20 LAB — T4, FREE: Free T4: 0.89 ng/dL (ref 0.60–1.60)

## 2023-09-20 NOTE — Patient Instructions (Addendum)
Please continue: - Synthroid 75 alternating with 50 mcg every other day.  Take the thyroid hormone every day, with water, at least 30 minutes before breakfast, separated by at least 4 hours from: - acid reflux medications - calcium - iron - multivitamins  Please restart 2500 units vitamin D.  Please stop at the lab.  Please come back for a follow-up appointment in 1 year.

## 2023-09-20 NOTE — Progress Notes (Signed)
Patient ID: Molly Prince, female   DOB: 26-Mar-1971, 52 y.o.   MRN: [id number]  HPI  Molly Prince is a 52 y.o.-year-old female, initially referred by Dr. Gerrit Friends, for management of thyroid cancer and postsurgical hypothyroidism. Last visit 1 year ago.  Interim history: She denies dysphagia or neck pressure.  Also, no choking or masses felt in the neck. No numbness around her mouth or cramps in her hands. She has occasional hot flushes (not bothersome)  - found to be in menopause.   Papillary thyroid cancer -Diagnosed in 01/2016.  Reviewed her thyroid cancer history: 10/27/2015: Thyroid ultrasound: Right thyroid lobe Measurements: 3.9 x 1.0 x 1.1 cm.  No nodules visualized. Left thyroid lobe Measurements: 3.5 x 1.8 x 1.8 cm. 2.3 x 1.8 x 1.8 cm heterogeneous solid mass within the left thyroid lobe. Smaller adjacent 8 mm solid nodule. Isthmus Thickness: 2 mm in thickness.  No nodules visualized. 11/26/2015: FNA: PTC (Bethesda category VI) 01/08/2016: Total thyroidectomy-Final pathology: Diagnosis Thyroid, thyroidectomy - PAPILLARY THYROID CARCINOMA, 2.4 CM. - CARCINOMA IS CONFINED TO THE THYROID. - THE SURGICAL RESECTION MARGINS ARE NEGATIVE FOR CARCINOMA. - SEE ONCOLOGY TABLE BELOW. Microscopic Comment THYROID Specimen: Thyroid. Procedure (including lymph node sampling if applicable): Total thyroidectomy. Specimen Integrity (intact/fragmented): Intact. Tumor focality: Unifocal Dominant tumor: Maximum tumor size (cm): 2.4 cm (gross measurement). Tumor laterality: Left lobe. Histologic type (including subtype and/or unique features as applicable): Papillary thyroid carcinoma. Tumor capsule: Not present. Extrathyroidal extension: Not identified. Capsular invasion with degree of invasion if present: N/A Margins: Negative for carcinoma. Lymphatic or vascular invasion: Not identified. Lymph nodes: # examined - 0; # positive; N/A Extracapsular extension (if applicable): Not  identified. TNM code: pT2, pNX Non-neoplastic thyroid: No significant findings. (JBK:gt, 01/11/16) 06/30/2016: RAI treatment 72 mCi 07/08/2016: Post treatment whole-body scan: negative for metastases 09/12/2017: Neck ultrasound: No residual/ recurrent tissue post thyroidectomy. 09/10/2021: Neck ultrasound: No residual/ recurrent tissue post thyroidectomy.  Thyroglobulin remains at the lower limit of detection, while ATA antibodies are not detectable: Lab Results  Component Value Date   THYROGLB 0.1 (L) 09/15/2022   THYROGLB 0.1 (L) 09/08/2021   THYROGLB 0.1 (L) 10/16/2020   THYROGLB 0.1 (L) 10/16/2019   THYROGLB 0.1 (L) 08/31/2018   THYROGLB 0.1 (L) 09/05/2017   THYROGLB 0.1 (L) 03/07/2017   THYROGLB 0.2 (L) 09/07/2016   THGAB <1 09/15/2022   THGAB <1 09/08/2021   THGAB <1 10/16/2020   THGAB <1 10/16/2019   THGAB <1 08/31/2018   THGAB <1 09/05/2017   THGAB <1 03/07/2017   THGAB <1 09/07/2016   Postsurgical hypothyroidism  Pt is on LT4 75 mcg alternating with 50 mcg every other day: - in am - fasting - at least 3h from b'fast - + calcium at night - seldom - no iron - no multivitamins - no PPIs - not on Biotin  Reviewed her TFTs: Lab Results  Component Value Date   TSH 2.08 03/30/2023   TSH 1.06 01/02/2023   TSH 0.99 09/15/2022   TSH 2.09 03/11/2022   TSH 0.13 (L) 12/29/2021   TSH 0.25 (L) 09/21/2021   TSH 0.56 09/08/2021   TSH 0.37 03/05/2021   TSH 0.08 (L) 01/12/2021   TSH 0.11 (L) 11/25/2020   FREET4 1.07 09/15/2022   FREET4 1.14 03/11/2022   FREET4 1.43 12/29/2021   FREET4 1.09 09/08/2021   FREET4 1.12 03/05/2021   FREET4 1.24 01/12/2021   FREET4 1.19 11/25/2020   FREET4 1.01 10/16/2020   FREET4 0.93 01/15/2020  FREET4 1.07 10/16/2019    No FH of thyroid cancer. No h/o radiation tx to head or neck except for RAI treatment.. No herbal supplements. No Biotin use. No recent steroids use.   Calcium levels reviewed: Lab Results  Component Value Date    CAION 4.7 09/15/2022   CAION 4.64 (L) 09/08/2021   CAION 4.7 03/05/2021   Lab Results  Component Value Date   CALCIUM 8.9 03/30/2023   CALCIUM 9.2 09/28/2022   CALCIUM 8.6 09/21/2021   CALCIUM 8.5 (L) 10/20/2020   CALCIUM 8.5 (L) 06/25/2020   CALCIUM 9.0 06/25/2019   CALCIUM 8.9 06/22/2018   CALCIUM 9.0 09/05/2017   CALCIUM 8.4 (L) 02/02/2017   CALCIUM 8.8 09/07/2016   She has a history of Vitamin D deficiency: Lab Results  Component Value Date   VD25OH 47 03/30/2023   VD25OH 31.89 09/15/2022   VD25OH 33.90 09/08/2021   VD25OH 36.9 03/05/2021   VD25OH 48 10/20/2020   VD25OH 19 (L) 06/25/2020   VD25OH 35 06/25/2019   VD25OH 33 06/22/2018   VD25OH 30.43 09/05/2017   VD25OH 30.10 03/07/2016   She was on ergocalciferol 50,000 units weekly >> then 1000 units - inconsistently. Now 5000 units + K2 every other day. Off calcium 1000 mg daily.  ROS: + see HPI  I reviewed pt's medications, allergies, PMH, social hx, family hx, and changes were documented in the history of present illness. Otherwise, unchanged from my initial visit note.  Past Medical History:  Diagnosis Date   Cancer [facility])    papillary thyroid carcinoma    Past Surgical History:  Procedure Laterality Date   THYROIDECTOMY N/A 01/08/2016   Procedure: TOTAL THYROIDECTOMY;  Surgeon: Darnell Level, MD;  Location: [facility] OR;  Service: General;  Laterality: N/A;   Social History   Social History   Marital Status: married    Spouse Name: N/A   Number of Children: 3   Occupational History   n/a   Social History Main Topics   Smoking status: Never Smoker    Smokeless tobacco: Not on file   Alcohol Use: 0.0 oz/week    0 Standard drinks or equivalent per week     Comment: social - Tequila   Drug Use: No   Sexual Activity: Yes     Comment: IUD- MIRENA   Social History Narrative   Age of menarche: 10 yrs   Gravida: 4 pregnancies                  3 children      Current Outpatient Medications on File Prior to  Visit  Medication Sig Dispense Refill   levonorgestrel (MIRENA) 20 MCG/24HR IUD 1 each by Intrauterine route once.     levothyroxine (SYNTHROID) 50 MCG tablet TAKE 1 TABLET BY MOUTH EVERY OTHER DAY ALTERNATING WITH 45 tablet 2   levothyroxine (SYNTHROID) 75 MCG tablet Take 1 tablet (75 mcg total) by mouth every other day. Take 75 mcg by mouth daily. 45 tablet 1   VITAMIN D PO Take by mouth.     No current facility-administered medications on file prior to visit.   No Known Allergies Family History  Problem Relation Age of Onset   Hypertension Mother    PE: BP 102/64   Pulse 62 Comment: manual  Ht 4' 11.5" (1.511 m)   Wt 112 lb 3.2 oz (50.9 kg)   BMI 22.28 kg/m  Wt Readings from Last 3 Encounters:  09/20/23 112 lb 3.2 oz (50.9 kg)  11/07/22  110 lb (49.9 kg)  09/28/22 108 lb (49 kg)   Constitutional: normal weight, in NAD Eyes:  EOMI, no exophthalmos ENT: no neck masses palpated-some scar tissue palpated around scar site, no cervical lymphadenopathy Cardiovascular: RRR, No MRG Respiratory: CTA B Musculoskeletal: no deformities Skin:no rashes - dark macular spots on R side of her cervical scar (permanent make-up) - see pic in previous visit note Neurological: no tremor with outstretched hands  ASSESSMENT: 1. Thyroid cancer - see HPI  2. Postsurgical Hypothyroidism  3.  Postsurgical hypocalcemia  PLAN:  1. Thyroid cancer - papillary -We discussed that she has very good prognosis and her thyroid cancer is unlikely to affect her life expectancy or quality of life -Her tumor was larger than 1.5 cm so we proceeded with RAI treatment for postop thyroid remnant ablation.  We discussed at that time that the main role of this is to facilitate monitoring in the long run, by checking thyroglobulin.  She had RAI with Thyrogen in 06/2016.  Posttreatment whole-body scan was negative for metastasis or extension into the neck.  We checked another neck ultrasound at the end of 2018  and this was negative for abnormal masses.  We repeated an ultrasound in 222 and this did not show any abnormalities in the neck -At last visit, she had 2 dark spots on the right side of her thyroidectomy scar.  She mentioned that she put permanent make-up to disguise the scar and give the appearance of 2 moles. -At last visit thyroglobulin level was stable, detectable but at the lower limit of detection, with undetectable antithyroglobulin antibodies -We will check these today -I also plan to repeat a neck ultrasound- plan to check in 1-2 years.  2. Patient with history of total thyroidectomy for thyroid cancer, now with iatrogenic hypothyroidism - latest thyroid labs reviewed with pt. >> normal: Lab Results  Component Value Date   TSH 2.08 03/30/2023  - she continues on LT4 50 alternating with 75 mcg every other day - pt feels good on this dose. - we discussed about taking the thyroid hormone every day, with water, >30 minutes before breakfast, separated by >4 hours from acid reflux medications, calcium, iron, multivitamins. Pt. is taking it correctly. - will check thyroid tests today: TSH and fT4 - If labs are abnormal, she will need to return for repeat TFTs in 1.5 months  3.  Postsurgical hypocalcemia -At last visit ionized calcium was normal, at 4.7 (4.7-5.5). -off calcium supplement -No acral cramping or perioral numbness -We will recheck her ionized calcium today  4.  Vitamin D deficiency: -She had a low vitamin D level, 19, in 06/2020 -She was started on weekly ergocalciferol 50,000 units per OB/GYN but at last visit she was on 1000 units vitamin D3 daily-but missing doses. Now on 5000 units + K2 >> every other day - would like to stay on this formulation. -Late vitamin D level was normal: Lab Results  Component Value Date   VD25OH 47 03/30/2023  -We will repeat the level today  Needs refills.   Component     Latest Ref Rng 09/20/2023  T4,Free(Direct)     0.60 - 1.60  ng/dL 4.09   TSH     8.11 - 9.14 uIU/mL 1.92   Comment --   Thyroglobulin Ab     < or = 1 IU/mL <1   Thyroglobulin     ng/mL 0.1 (L)   Calcium Ionized     4.7 - 5.5 mg/dL 5.0  VITD     30.00 - 100.00 ng/mL 51.70   Thyroid tests and vitamin D level at goal.  Carlus Pavlov, MD PhD [facility] Endocrinology

## 2023-09-22 LAB — CALCIUM, IONIZED: Calcium, Ion: 5 mg/dL (ref 4.7–5.5)

## 2023-09-22 LAB — THYROGLOBULIN LEVEL: Thyroglobulin: 0.1 ng/mL — ABNORMAL LOW

## 2023-09-22 LAB — THYROGLOBULIN ANTIBODY: Thyroglobulin Ab: <1 [IU]/mL (ref <=1)

## 2023-09-25 MED ORDER — LEVOTHYROXINE SODIUM 75 MCG PO TABS: 75.0000 ug | ORAL_TABLET | ORAL | 3 refills | Status: DC

## 2023-09-25 MED ORDER — LEVOTHYROXINE SODIUM 50 MCG PO TABS: 50.0000 ug | ORAL_TABLET | ORAL | 3 refills | Status: DC

## 2023-09-27 NOTE — Progress Notes (Signed)
52 y.o. G67P3020 Married Caucasian Hispanic female here for annual exam.    Some hot flashes.  Manageable.   Has Mirena IUD.  Wants fasting labs.   3 children.  89 yo daughter, 8 yo daughter, 29 yo daughter in [place].  Has a grandchild.   PCP: [facility]  Endocrinology:  Dr. Elvera Lennox.  No LMP recorded. (Menstrual status: IUD).           Sexually active: Yes.    The current method of family planning is IUD--Mirena 09/11/20.    Menopausal hormone therapy:  n/a Exercising: Yes.     Walking, weight lifting Smoker:  no  OB History  Gravida Para Term Preterm AB Living  5 3 3  0 2    SAB IAB Ectopic Multiple Live Births  2 0 0        # Outcome Date GA Lbr Len/2nd Weight Sex Type Anes PTL Lv  5 SAB           4 SAB           3 Term           2 Term           1 Term              HEALTH MAINTENANCE: Last 2 paps:  09/21/21 neg, 06/25/19 neg History of abnormal Pap or positive HPV:  no Mammogram:   04/19/23 Breast Density Cat C, BI-RADS CAT 1 neg Colonoscopy:  n/a Bone Density:  n/a  Result  n/a   Immunization History  Administered Date(s) Administered   Influenza,inj,Quad PF,6+ Mos 09/22/2014, 08/31/2018, 08/28/2019, 07/21/2020, 01/02/2023   PFIZER(Purple Top)SARS-COV-2 Vaccination 01/06/2020, 02/04/2020   Pneumococcal Conjugate-13 08/31/2018   Tdap 10/02/2019   Zoster Recombinant(Shingrix) 07/21/2020      reports that she has never smoked. She has never used smokeless tobacco. She reports that she does not drink alcohol and does not use drugs.  Past Medical History:  Diagnosis Date   Cancer [facility])    papillary thyroid carcinoma     Past Surgical History:  Procedure Laterality Date   THYROIDECTOMY N/A 01/08/2016   Procedure: TOTAL THYROIDECTOMY;  Surgeon: Darnell Level, MD;  Location: [facility] OR;  Service: General;  Laterality: N/A;    Current Outpatient Medications  Medication Sig Dispense Refill   levonorgestrel (MIRENA) 20 MCG/24HR IUD 1  each by Intrauterine route once.     levothyroxine (SYNTHROID) 50 MCG tablet Take 1 tablet (50 mcg total) by mouth every other day. 45 tablet 3   levothyroxine (SYNTHROID) 75 MCG tablet Take 1 tablet (75 mcg total) by mouth every other day. 45 tablet 3   VITAMIN D PO Take by mouth.     No current facility-administered medications for this visit.    ALLERGIES: Patient has no known allergies.  Family History  Problem Relation Age of Onset   Hypertension Mother     Review of Systems  All other systems reviewed and are negative.   PHYSICAL EXAM:  BP 126/82 (BP Location: Right Arm, Patient Position: Sitting, Cuff Size: Normal)   Ht 5' (1.524 m)   Wt 105 lb (47.6 kg)   BMI 20.51 kg/m     General appearance: alert, cooperative and appears stated age Head: normocephalic, without obvious abnormality, atraumatic Neck: no adenopathy, supple, symmetrical, trachea midline and thyroid normal to inspection and palpation Lungs: clear to auscultation bilaterally Breasts: normal appearance, no masses or tenderness, No nipple retraction or dimpling, No  nipple discharge or bleeding, No axillary adenopathy Heart: regular rate and rhythm Abdomen: soft, non-tender; no masses, no organomegaly Extremities: extremities normal, atraumatic, no cyanosis or edema Skin: skin color, texture, turgor normal. No rashes or lesions.  3 mm darkly pigmented flat nevus of midline back and left abdominal skin.  Lymph nodes: cervical, supraclavicular, and axillary nodes normal. Neurologic: grossly normal  Pelvic: External genitalia:  no lesions              No abnormal inguinal nodes palpated.              Urethra:  normal appearing urethra with no masses, tenderness or lesions              Bartholins and Skenes: normal                 Vagina: normal appearing vagina with normal color and discharge, no lesions              Cervix: no lesions              Pap taken: No. Bimanual Exam:  Uterus:  normal size,  contour, position, consistency, mobility, non-tender              Adnexa: no mass, fullness, tenderness              Rectal exam: Yes.  .  Confirms.              Anus:  normal sphincter tone, no lesions  Chaperone was present for exam:  Warren Lacy, CMA  ASSESSMENT: Well woman visit with gynecologic exam Mirena IUD, 2021.  Status post thyroidectomy for thyroid cancer.  Colon cancer screening.  Pigmented nevi.   PLAN: Mammogram screening discussed. Self breast awareness reviewed. Pap and HRV collected:  no, due in 2025.  Guidelines for Calcium, Vitamin D, regular exercise program including cardiovascular and weight bearing exercise. Medication refills:  NA Routine labs. Colon cancer screening options discussed. Cologuard ordered.  Referral to dermatology. Follow up:  1 year and prn.

## 2023-10-11 ENCOUNTER — Ambulatory Visit (INDEPENDENT_AMBULATORY_CARE_PROVIDER_SITE_OTHER): Payer: BC Managed Care – PPO | Admitting: Obstetrics and Gynecology

## 2023-10-11 ENCOUNTER — Encounter: Payer: Self-pay | Admitting: Obstetrics and Gynecology

## 2023-10-11 VITALS — BP 126/82 | Ht 60.0 in | Wt 105.0 lb

## 2023-10-11 DIAGNOSIS — Z1211 Encounter for screening for malignant neoplasm of colon: Secondary | ICD-10-CM

## 2023-10-11 DIAGNOSIS — Z0000 Encounter for general adult medical examination without abnormal findings: Secondary | ICD-10-CM

## 2023-10-11 DIAGNOSIS — Z01419 Encounter for gynecological examination (general) (routine) without abnormal findings: Secondary | ICD-10-CM

## 2023-10-11 DIAGNOSIS — D229 Melanocytic nevi, unspecified: Secondary | ICD-10-CM

## 2023-10-11 NOTE — Patient Instructions (Signed)

## 2023-10-12 ENCOUNTER — Encounter: Payer: Self-pay | Admitting: Obstetrics and Gynecology

## 2023-10-12 LAB — COMPREHENSIVE METABOLIC PANEL
AG Ratio: 1.7 (calc) (ref 1.0–2.5)
ALT: 18 U/L (ref 6–29)
AST: 18 U/L (ref 10–35)
Albumin: 4.3 g/dL (ref 3.6–5.1)
Alkaline phosphatase (APISO): 72 U/L (ref 37–153)
BUN: 17 mg/dL (ref 7–25)
CO2: 28 mmol/L (ref 20–32)
Calcium: 8.8 mg/dL (ref 8.6–10.4)
Chloride: 102 mmol/L (ref 98–110)
Creat: 0.63 mg/dL (ref 0.50–1.03)
Globulin: 2.6 g/dL (ref 1.9–3.7)
Glucose, Bld: 80 mg/dL (ref 65–99)
Potassium: 3.8 mmol/L (ref 3.5–5.3)
Sodium: 140 mmol/L (ref 135–146)
Total Bilirubin: 0.5 mg/dL (ref 0.2–1.2)
Total Protein: 6.9 g/dL (ref 6.1–8.1)

## 2023-10-12 LAB — LIPID PANEL
Cholesterol: 237 mg/dL — ABNORMAL HIGH (ref <200)
HDL: 69 mg/dL (ref >=50)
LDL Cholesterol (Calc): 153 mg/dL — ABNORMAL HIGH
Non-HDL Cholesterol (Calc): 168 mg/dL — ABNORMAL HIGH (ref <130)
Total CHOL/HDL Ratio: 3.4 (calc) (ref <5.0)
Triglycerides: 62 mg/dL (ref <150)

## 2023-10-12 LAB — CBC
HCT: 40.9 % (ref 35.0–45.0)
Hemoglobin: 13.5 g/dL (ref 11.7–15.5)
MCH: 30 pg (ref 27.0–33.0)
MCHC: 33 g/dL (ref 32.0–36.0)
MCV: 90.9 fL (ref 80.0–100.0)
MPV: 11.2 fL (ref 7.5–12.5)
Platelets: 268 10*3/uL (ref 140–400)
RBC: 4.5 10*6/uL (ref 3.80–5.10)
RDW: 13.7 % (ref 11.0–15.0)
WBC: 5.9 10*3/uL (ref 3.8–10.8)

## 2023-11-15 ENCOUNTER — Other Ambulatory Visit: Payer: Self-pay | Admitting: Internal Medicine

## 2023-11-15 DIAGNOSIS — E890 Postprocedural hypothyroidism: Secondary | ICD-10-CM

## 2023-11-15 NOTE — Telephone Encounter (Signed)
 Levothyroxine refill request complete

## 2023-11-24 ENCOUNTER — Other Ambulatory Visit: Payer: Self-pay

## 2023-11-24 DIAGNOSIS — E890 Postprocedural hypothyroidism: Secondary | ICD-10-CM

## 2023-11-24 MED ORDER — LEVOTHYROXINE SODIUM 50 MCG PO TABS: 50.0000 ug | ORAL_TABLET | ORAL | 3 refills | Status: DC

## 2024-01-10 ENCOUNTER — Telehealth: Payer: Self-pay | Admitting: Obstetrics and Gynecology

## 2024-01-10 NOTE — Telephone Encounter (Signed)
 Please contact patient regarding her Cologuard colon cancer screening.   No test has been received to date.   She came up in my reminder box in Epic.   If she declines to complete the Cologuard in the next month, I will cancel the order.   She can opt for a colonoscopy for colon cancer screening instead.

## 2024-01-11 NOTE — Telephone Encounter (Signed)
 Call placed to patient using [employer] ID# [phone]. Left message to call [facility] Triage at [phone], option 4.

## 2024-01-26 NOTE — Telephone Encounter (Signed)
 Used Interpreter line:  ID Darlene # [id number]  LVMTCB.   Will also send mychart msg to the pt.

## 2024-02-01 NOTE — Telephone Encounter (Signed)
 Per Dr. Kennith Center: "Ok to close."

## 2024-02-13 ENCOUNTER — Telehealth: Payer: Self-pay | Admitting: Internal Medicine

## 2024-02-13 ENCOUNTER — Other Ambulatory Visit: Payer: Self-pay

## 2024-02-13 DIAGNOSIS — E890 Postprocedural hypothyroidism: Secondary | ICD-10-CM

## 2024-02-13 MED ORDER — LEVOTHYROXINE SODIUM 75 MCG PO TABS: 75.0000 ug | ORAL_TABLET | Freq: Every day | ORAL | 1 refills | Status: DC

## 2024-02-13 MED ORDER — LEVOTHYROXINE SODIUM 50 MCG PO TABS: 50.0000 ug | ORAL_TABLET | ORAL | 3 refills | Status: DC

## 2024-02-13 NOTE — Telephone Encounter (Signed)
 Sent medication to pharmacy.

## 2024-02-13 NOTE — Telephone Encounter (Signed)
 MEDICATION:  1)  levothyroxine levothyroxine (SYNTHROID) 50 MCG tablet  2)  levothyroxine levothyroxine (SYNTHROID) 75 MCG tablet  PHARMACY:    [id number] - [place], [place] - [address] (Ph: [phone])    HAS THE PATIENT CONTACTED THEIR PHARMACY?  Yes  IS THIS A 90 DAY SUPPLY : Yes  IS PATIENT OUT OF MEDICATION: Yes  IF NOT; HOW MUCH IS LEFT:   LAST APPOINTMENT DATE: @ 09/20/2023  NEXT APPOINTMENT DATE:@ 09/18/2024  DO WE HAVE YOUR PERMISSION TO LEAVE A DETAILED MESSAGE?: Yes  OTHER COMMENTS:    **Let patient know to contact pharmacy at the end of the day to make sure medication is ready. **  ** Please notify patient to allow 48-72 hours to process**  **Encourage patient to contact the pharmacy for refills or they can request refills through [facility]**

## 2024-05-27 ENCOUNTER — Other Ambulatory Visit: Payer: Self-pay

## 2024-05-27 ENCOUNTER — Telehealth: Payer: Self-pay | Admitting: Internal Medicine

## 2024-05-27 DIAGNOSIS — E890 Postprocedural hypothyroidism: Secondary | ICD-10-CM

## 2024-05-27 MED ORDER — LEVOTHYROXINE SODIUM 75 MCG PO TABS: 75.0000 ug | ORAL_TABLET | Freq: Every day | ORAL | 1 refills | Status: DC

## 2024-05-27 MED ORDER — LEVOTHYROXINE SODIUM 50 MCG PO TABS: 50.0000 ug | ORAL_TABLET | ORAL | 3 refills | Status: DC

## 2024-05-27 NOTE — Telephone Encounter (Signed)
 Patient called stating she is out of her levothyroxine .She is needing a refill sent to [id number] - [place], [place] - [address] (Ph: [phone])  The patient's contact info is [phone]

## 2024-08-20 ENCOUNTER — Telehealth: Payer: Self-pay | Admitting: Internal Medicine

## 2024-08-20 DIAGNOSIS — E890 Postprocedural hypothyroidism: Secondary | ICD-10-CM

## 2024-08-20 MED ORDER — LEVOTHYROXINE SODIUM 50 MCG PO TABS: 50.0000 ug | ORAL_TABLET | ORAL | 3 refills | Status: DC

## 2024-08-20 NOTE — Telephone Encounter (Signed)
 MEDICATION: Levothyroxine  (SYNTHROID ) 50 MCG   PHARMACY:  [id number] - [place], [place] - [address], [place] [address] Phone: [phone]  Fax: [phone]   HAS THE PATIENT CONTACTED THEIR PHARMACY?    LAST REFILL:  @@LASTREFILL @  IS THIS A 90 DAY SUPPLY : Yes  IS PATIENT OUT OF MEDICATION:   IF NOT; HOW MUCH IS LEFT:   LAST APPOINTMENT DATE: @7 /21/2025  NEXT APPOINTMENT DATE:@11 /10/2024  DO WE HAVE YOUR PERMISSION TO LEAVE A DETAILED MESSAGE?: Yes  OTHER COMMENTS:    **Let patient know to contact pharmacy at the end of the day to make sure medication is ready. **  ** Please notify patient to allow 48-72 hours to process**  **Encourage patient to contact the pharmacy for refills or they can request refills through [facility]**

## 2024-08-22 ENCOUNTER — Other Ambulatory Visit: Payer: Self-pay | Admitting: Internal Medicine

## 2024-08-22 DIAGNOSIS — E890 Postprocedural hypothyroidism: Secondary | ICD-10-CM

## 2024-09-18 ENCOUNTER — Encounter: Payer: Self-pay | Admitting: Internal Medicine

## 2024-09-18 ENCOUNTER — Other Ambulatory Visit

## 2024-09-18 ENCOUNTER — Ambulatory Visit: Payer: BC Managed Care – PPO | Admitting: Internal Medicine

## 2024-09-18 VITALS — BP 100/62 | HR 62 | Ht 60.0 in | Wt 108.4 lb

## 2024-09-18 DIAGNOSIS — E890 Postprocedural hypothyroidism: Secondary | ICD-10-CM | POA: Diagnosis not present

## 2024-09-18 DIAGNOSIS — E559 Vitamin D deficiency, unspecified: Secondary | ICD-10-CM

## 2024-09-18 DIAGNOSIS — C73 Malignant neoplasm of thyroid gland: Secondary | ICD-10-CM

## 2024-09-18 NOTE — Patient Instructions (Addendum)
 Please continue: - Synthroid  75 alternating with 50 mcg every other day.  Take the thyroid  hormone every day, with water, at least 30 minutes before breakfast, separated by at least 4 hours from: - acid reflux medications - calcium  - iron - multivitamins  Please stop at the lab.  Please come back for a follow-up appointment in 1 year.

## 2024-09-18 NOTE — Progress Notes (Addendum)
 Patient ID: Molly Prince, female   DOB: [birth year], 53 y.o.   MRN: [id number]  HPI  Molly Prince is a 53 y.o.-year-old female, initially referred by Dr. Eletha, for management of thyroid  cancer and postsurgical hypothyroidism. Last visit 1 year ago.  Interim history: She denies dysphagia or neck pressure.   No numbness around her mouth or cramps in her hands.  Papillary thyroid  cancer -Diagnosed in 01/2016.  Reviewed her thyroid  cancer history: 10/27/2015: Thyroid  ultrasound: Right thyroid  lobe Measurements: 3.9 x 1.0 x 1.1 cm.  No nodules visualized. Left thyroid  lobe Measurements: 3.5 x 1.8 x 1.8 cm. 2.3 x 1.8 x 1.8 cm heterogeneous solid mass within the left thyroid  lobe. Smaller adjacent 8 mm solid nodule. Isthmus Thickness: 2 mm in thickness.  No nodules visualized. 11/26/2015: FNA: PTC ([employer]hes[employer] category VI) 01/08/2016: Total thyroidectomy-Final pathology: Diagnosis Thyroid , thyroidectomy - PAPILLARY THYROID  CARCINOMA, 2.4 CM. - CARCINOMA IS CONFINED TO THE THYROID . - THE SURGICAL RESECTION MARGINS ARE NEGATIVE FOR CARCINOMA. - SEE ONCOLOGY TABLE BELOW. Microscopic Comment THYROID  Specimen: Thyroid . Procedure (including lymph node sampling if applicable): Total thyroidectomy. Specimen Integrity (intact/fragmented): Intact. Tumor focality: Unifocal Dominant tumor: Maximum tumor size (cm): 2.4 cm (gross measurement). Tumor laterality: Left lobe. Histologic type (including subtype and/or unique features as applicable): Papillary thyroid  carcinoma. Tumor capsule: Not present. Extrathyroidal extension: Not identified. Capsular invasion with degree of invasion if present: N/A Margins: Negative for carcinoma. Lymphatic or vascular invasion: Not identified. Lymph nodes: # examined - 0; # positive; N/A Extracapsular extension (if applicable): Not identified. TNM code: pT2, pNX Non-neoplastic thyroid : No significant findings. (JBK:gt, 01/11/16) 06/30/2016: RAI treatment  72 mCi 07/08/2016: Post treatment whole-body scan: negative for metastases 09/12/2017: Neck ultrasound: No residual/ recurrent tissue post thyroidectomy. 09/10/2021: Neck ultrasound: No residual/ recurrent tissue post thyroidectomy.  Thyroglobulin remains at the lower limit of detection, while ATA antibodies are not detectable: Lab Results  Component Value Date   THYROGLB 0.1 (L) 09/20/2023   THYROGLB 0.1 (L) 09/15/2022   THYROGLB 0.1 (L) 09/08/2021   THYROGLB 0.1 (L) 10/16/2020   THYROGLB 0.1 (L) 10/16/2019   THYROGLB 0.1 (L) 08/31/2018   THYROGLB 0.1 (L) 09/05/2017   THYROGLB 0.1 (L) 03/07/2017   THYROGLB 0.2 (L) 09/07/2016   THGAB <1 09/20/2023   THGAB <1 09/15/2022   THGAB <1 09/08/2021   THGAB <1 10/16/2020   THGAB <1 10/16/2019   THGAB <1 08/31/2018   THGAB <1 09/05/2017   THGAB <1 03/07/2017   THGAB <1 09/07/2016   Postsurgical hypothyroidism  Pt is on LT4 75 mcg alternating with 50 mcg every other day: - in am - fasting - at least 3h from b'fast - + calcium  at night - seldom - no iron - no multivitamins - no PPIs - not on Biotin  Reviewed her TFTs: Lab Results  Component Value Date   TSH 1.92 09/20/2023   TSH 2.08 03/30/2023   TSH 1.06 01/02/2023   TSH 0.99 09/15/2022   TSH 2.09 03/11/2022   TSH 0.13 (L) 12/29/2021   TSH 0.25 (L) 09/21/2021   TSH 0.56 09/08/2021   TSH 0.37 03/05/2021   TSH 0.08 (L) 01/12/2021   FREET4 0.89 09/20/2023   FREET4 1.07 09/15/2022   FREET4 1.14 03/11/2022   FREET4 1.43 12/29/2021   FREET4 1.09 09/08/2021   FREET4 1.12 03/05/2021   FREET4 1.24 01/12/2021   FREET4 1.19 11/25/2020   FREET4 1.01 10/16/2020   FREET4 0.93 01/15/2020    No FH of thyroid  cancer. No h/o  radiation tx to head or neck except for RAI treatment.. No herbal supplements. No Biotin use. No recent steroids use.   Calcium  levels reviewed: Lab Results  Component Value Date   CAION 5.0 09/20/2023   CAION 4.7 09/15/2022   CAION 4.64 (L) 09/08/2021    CAION 4.7 03/05/2021   Lab Results  Component Value Date   CALCIUM  8.8 10/11/2023   CALCIUM  8.9 03/30/2023   CALCIUM  9.2 09/28/2022   CALCIUM  8.6 09/21/2021   CALCIUM  8.5 (L) 10/20/2020   CALCIUM  8.5 (L) 06/25/2020   CALCIUM  9.0 06/25/2019   CALCIUM  8.9 06/22/2018   CALCIUM  9.0 09/05/2017   CALCIUM  8.4 (L) 02/02/2017   She has a history of Vitamin D  deficiency: Lab Results  Component Value Date   VD25OH 51.70 09/20/2023   VD25OH 47 03/30/2023   VD25OH 31.89 09/15/2022   VD25OH 33.90 09/08/2021   VD25OH 36.9 03/05/2021   VD25OH 48 10/20/2020   VD25OH 19 (L) 06/25/2020   VD25OH 35 06/25/2019   VD25OH 33 06/22/2018   VD25OH 30.43 09/05/2017   She was on ergocalciferol  50,000 units weekly >> then 1000 units - inconsistently. Then 5000 units + K2 every other day >> not taking now. She was previously on calcium  supplements, now off.  ROS: + see HPI  I reviewed pt's medications, allergies, PMH, social hx, family hx, and changes were documented in the history of present illness. Otherwise, unchanged from my initial visit note.  Past Medical History:  Diagnosis Date   Cancer (HCC)    papillary thyroid  carcinoma    Elevated LDL cholesterol level    Past Surgical History:  Procedure Laterality Date   THYROIDECTOMY N/A 01/08/2016   Procedure: TOTAL THYROIDECTOMY;  Surgeon: Krystal Spinner, MD;  Location: [facility] OR;  Service: General;  Laterality: N/A;   Social History   Social History   Marital Status: married    Spouse Name: N/A   Number of Children: 3   Occupational History   n/a   Social History Main Topics   Smoking status: Never Smoker    Smokeless tobacco: Not on file   Alcohol Use: 0.0 oz/week    0 Standard drinks or equivalent per week     Comment: social - [employer]equ[employer]   Drug Use: No   Sexual Activity: Yes     Comment: IUD- MIRENA    Social History Narrative   Age of menarche: 10 yrs   Gravida: 4 pregnancies                  3 children      Current Outpatient  Medications on File Prior to Visit  Medication Sig Dispense Refill   levonorgestrel  (MIRENA ) 20 MCG/24HR IUD 1 each by Intrauterine route once.     levothyroxine  (SYNTHROID ) 50 MCG tablet Take 1 tablet (50 mcg total) by mouth every other day. 45 tablet 3   levothyroxine  (SYNTHROID ) 75 MCG tablet TAKE 1 TABLET BY MOUTH DAILY 90 tablet 1   VITAMIN D  PO Take by mouth.     No current facility-administered medications on file prior to visit.   No Known Allergies Family History  Problem Relation Age of Onset   Hypertension Mother    PE: BP 100/62   Pulse 62   Ht 5' (1.524 m)   Wt 108 lb 6.4 oz (49.2 kg)   BMI 21.17 kg/m  Wt Readings from Last 3 Encounters:  09/18/24 108 lb 6.4 oz (49.2 kg)  10/11/23 105 lb (47.6 kg)  09/20/23 112  lb 3.2 oz (50.9 kg)   Constitutional: normal weight, in NAD Eyes:  EOMI, no exophthalmos ENT: no neck masses palpated, no cervical lymphadenopathy Cardiovascular: RRR, No MRG Respiratory: CTA B Musculoskeletal: no deformities Skin:no rashes - dark macular spots on R side of her cervical scar (permanent make-up) - see pic in previous visit note Neurological: no tremor with outstretched hands  ASSESSMENT: 1. Thyroid  cancer - see HPI  2. Postsurgical Hypothyroidism  3.  Postsurgical hypocalcemia  PLAN:  1. Thyroid  cancer - papillary -We discussed that she has very good prognosis and her thyroid  cancer is unlikely to affect her life expectancy or quality of life -Her tumor was larger than 1.5 cm so we proceeded with RAI treatment for postop thyroid  remnant ablation.  We discussed at that time that the main role of this is to facilitate monitoring in the long run, by checking thyroglobulin.  She had RAI with Thyrogen  in 06/2016 and a posttreatment whole-body scan was negative for metastasis or suspicious masses in her neck.   We repeated an ultrasound in 2018 and also in 2022 and this did not show any abnormalities in the neck. - She has 2 dark spots on  the right side of her thyroidectomy scar.  She mentioned that she put permanent make-up to disguise the scar and give the appearance of 2 moles. - At last check her thyroglobulin level was stable, detectable but at the lower limit of detection, with undetectable ATA - We will recheck these today - I also plan to recheck a neck ultrasound next year, or if her thyroglobulin level increases - We discussed that beyond the next ultrasound, we may only repeat another ultrasound if needed, possibly in 10 years.  2. Patient with history of total thyroidectomy for thyroid  cancer, now with iatrogenic hypothyroidism - latest thyroid  labs reviewed with pt. >> normal: Lab Results  Component Value Date   TSH 1.92 09/20/2023  - she continues on LT4 50 alternating with 75 mcg every other day - pt feels good on this dose. - we discussed about taking the thyroid  hormone every day, with water, >30 minutes before breakfast, separated by >4 hours from acid reflux medications, calcium , iron, multivitamins. Pt. is taking it correctly. - will check thyroid  tests today: TSH and fT4 - If labs are abnormal, she will need to return for repeat TFTs in 1.5 months  3.  Postsurgical hypocalcemia - At last visit ionized calcium  was at goal, at 5.0 (4.7-5.5) - She is off her calcium  supplement now and unfortunately also not taking the vitamin D  supplement.  We again discussed why this is important.  We will check her vitamin D  level today as mentioned below. - Acral cramping or perioral numbness - We will recheck her ionized calcium  today  4.  Vitamin D  deficiency: - She had a low vitamin D  of 19 in 2021 - She was started on weekly ergocalciferol  50,000 units per OB/GYN, but then switched to 5000 units vitamin D3 + K2 every other day, which she preferred.  However, at today's visit she tells me that she has not really taken this in the last year.  We discussed about the importance of taking vitamin D  but we will also check  the level today to see if we absolutely need to start this back. - On the above dose, vitamin D  level was normal at last check: Lab Results  Component Value Date   VD25OH 51.70 09/20/2023   Needs refills to reset her levothyroxine  prescription  until next visit.  Component     Latest Ref Rng 09/18/2024  T4,Free(Direct)     0.8 - 1.8 ng/dL 1.2   TSH     mIU/L 7.71   Thyroglobulin Ab     < or = 1 IU/mL <1   Thyroglobulin     ng/mL 0.1 (L)   Comment -   Vitamin D , 25-Hydroxy     30 - 100 ng/mL 40   Calcium  Ionized     4.7 - 5.5 mg/dL 4.8     TSH is a little high for thyroid  cancer follow-up.  I would suggest to increase the dose of LT4 to 75 mcg daily and repeat the TFTs in 1.5 months.  The rest of the labs are at goal.  Lela Fendt, MD PhD [facility]

## 2024-09-20 LAB — THYROGLOBULIN ANTIBODY: Thyroglobulin Ab: <1 [IU]/mL (ref <=1)

## 2024-09-20 LAB — T4, FREE: Free T4: 1.2 ng/dL (ref 0.8–1.8)

## 2024-09-20 LAB — CALCIUM, IONIZED: Calcium, Ion: 4.8 mg/dL (ref 4.7–5.5)

## 2024-09-20 LAB — VITAMIN D 25 HYDROXY (VIT D DEFICIENCY, FRACTURES): Vit D, 25-Hydroxy: 40 ng/mL (ref 30–100)

## 2024-09-20 LAB — TSH: TSH: 2.28 m[IU]/L

## 2024-09-20 LAB — THYROGLOBULIN LEVEL: Thyroglobulin: 0.1 ng/mL — ABNORMAL LOW

## 2024-09-23 ENCOUNTER — Ambulatory Visit: Payer: Self-pay | Admitting: Internal Medicine

## 2024-09-23 MED ORDER — LEVOTHYROXINE SODIUM 75 MCG PO TABS: 75.0000 ug | ORAL_TABLET | Freq: Every day | ORAL | 3 refills | Status: AC

## 2024-09-23 NOTE — Addendum Note (Signed)
 Addended by: TRIXIE FILE on: 09/23/2024 12:56 PM   Modules accepted: Orders

## 2024-10-16 ENCOUNTER — Ambulatory Visit: Payer: BC Managed Care – PPO | Admitting: Obstetrics and Gynecology

## 2024-12-12 ENCOUNTER — Other Ambulatory Visit

## 2024-12-13 LAB — TSH: TSH: 0.69 m[IU]/L

## 2024-12-13 LAB — T4, FREE: Free T4: 1.5 ng/dL (ref 0.8–1.8)

## 2025-02-06 ENCOUNTER — Ambulatory Visit: Admitting: Obstetrics and Gynecology

## 2025-09-17 ENCOUNTER — Ambulatory Visit: Admitting: Internal Medicine
